# Patient Record
Sex: Male | Born: 1985 | Race: White | Hispanic: Yes | Marital: Married | Smoking: Former smoker
Health system: Southern US, Community
[De-identification: ages and names within clinical notes are randomized; demographics above are authoritative.]

## PROBLEM LIST (undated history)

## (undated) DIAGNOSIS — Z872 Personal history of diseases of the skin and subcutaneous tissue: Secondary | ICD-10-CM

## (undated) DIAGNOSIS — H6990 Unspecified Eustachian tube disorder, unspecified ear: Secondary | ICD-10-CM

## (undated) DIAGNOSIS — I10 Essential (primary) hypertension: Secondary | ICD-10-CM

## (undated) DIAGNOSIS — H6980 Other specified disorders of Eustachian tube, unspecified ear: Secondary | ICD-10-CM

## (undated) DIAGNOSIS — T7840XA Allergy, unspecified, initial encounter: Secondary | ICD-10-CM

## (undated) HISTORY — DX: Allergy, unspecified, initial encounter: T78.40XA

## (undated) HISTORY — DX: Unspecified eustachian tube disorder, unspecified ear: H69.90

## (undated) HISTORY — DX: Personal history of diseases of the skin and subcutaneous tissue: Z87.2

## (undated) HISTORY — DX: Essential (primary) hypertension: I10

## (undated) HISTORY — DX: Other specified disorders of Eustachian tube, unspecified ear: H69.80

## (undated) HISTORY — PX: WISDOM TOOTH EXTRACTION: SHX21

---

## 2007-07-08 ENCOUNTER — Emergency Department: Payer: Self-pay | Admitting: Emergency Medicine

## 2008-04-17 IMAGING — CR DG CHEST 2V
1 series · 2 of 2 positions shown · non-contrast
Comparison: none

REASON FOR EXAM: pt in [place]
COMMENTS:

[Series 1: view not recorded · 0.17mm/px · 2 of 2 slices shown]
[im 1/2]
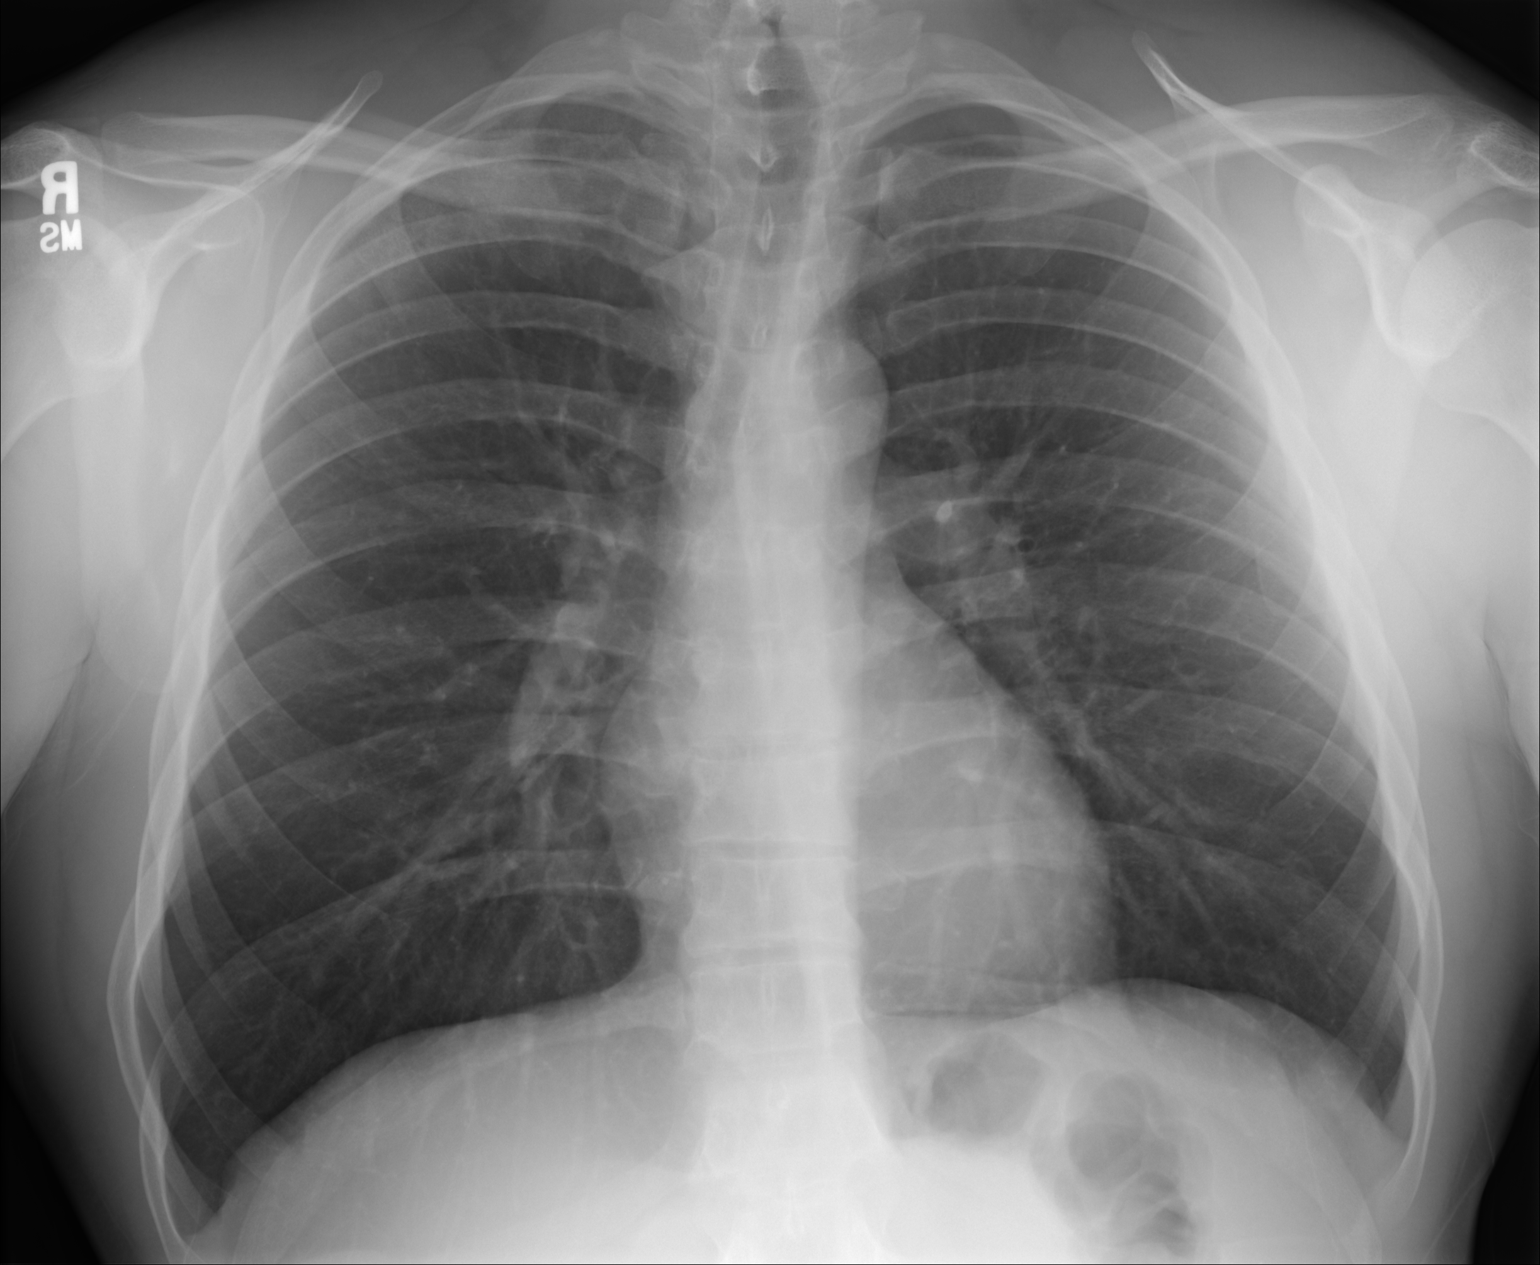
[im 2/2]
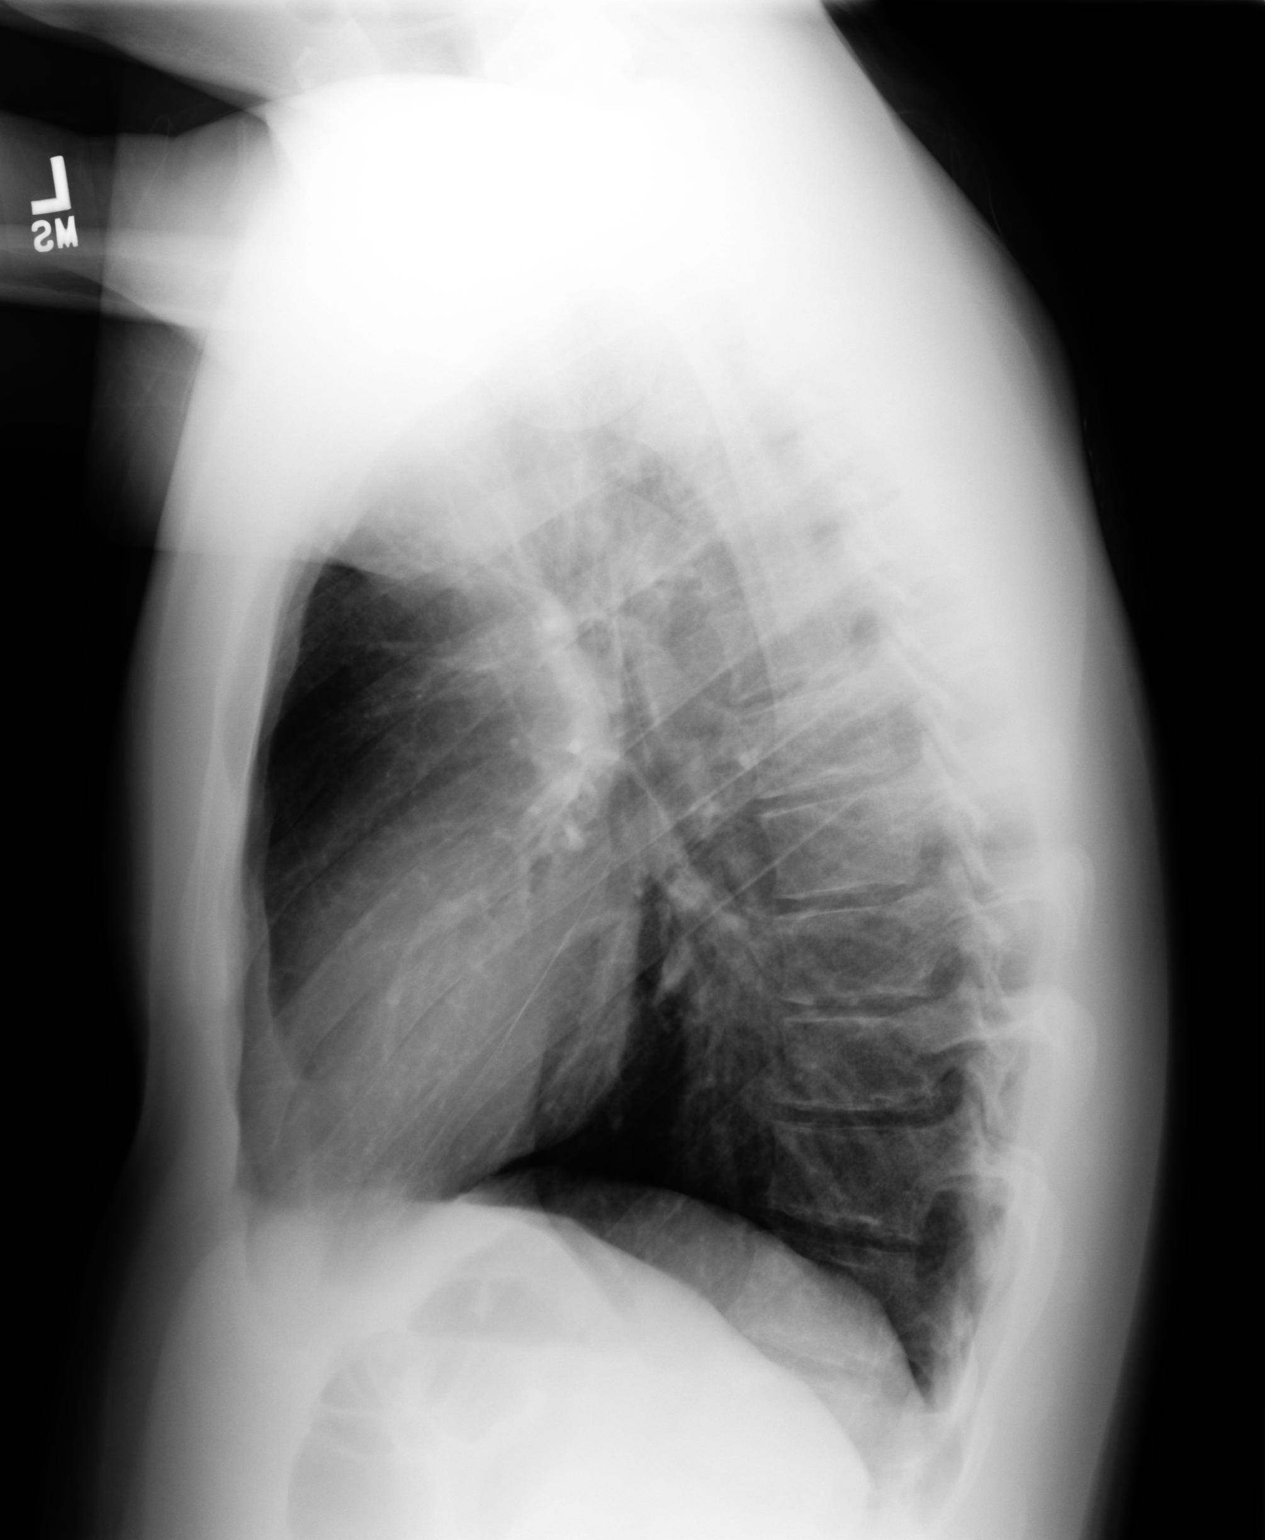

[2 of 2 positions shown; findings below may reference images not displayed]

PROCEDURE:     DXR - DXR CHEST PA (OR AP) AND LATERAL  - July 08, 2007 [DATE]

RESULT:     There is no prior exam available for comparison.

The lungs are clear. The heart and pulmonary vessels are normal. The bony
and mediastinal structures are unremarkable. There is no effusion. There is
no pneumothorax or evidence of congestive failure.
IMPRESSION: No acute cardiopulmonary disease. Incidental note is made
of a very minimal thoracic scoliosis, concave to the LEFT. This could be
positional. Correlation with clinical findings is recommended.

## 2013-07-22 ENCOUNTER — Ambulatory Visit
Admission: RE | Admit: 2013-07-22 | Discharge: 2013-07-22 | Disposition: A | Discharge status: Home / Self Care | Payer: No Typology Code available for payment source | Source: Ambulatory Visit | Attending: Occupational Medicine | Admitting: Occupational Medicine

## 2013-07-22 ENCOUNTER — Other Ambulatory Visit: Payer: Self-pay | Admitting: Occupational Medicine

## 2013-07-22 DIAGNOSIS — Z021 Encounter for pre-employment examination: Secondary | ICD-10-CM

## 2016-10-08 ENCOUNTER — Encounter: Payer: Self-pay | Admitting: Family Medicine

## 2016-10-08 ENCOUNTER — Ambulatory Visit (INDEPENDENT_AMBULATORY_CARE_PROVIDER_SITE_OTHER): Payer: Commercial Managed Care - HMO | Admitting: Family Medicine

## 2016-10-08 VITALS — BP 130/78 | HR 68 | Temp 98.9°F | Resp 16 | Wt 215.0 lb

## 2016-10-08 DIAGNOSIS — K1120 Sialoadenitis, unspecified: Secondary | ICD-10-CM

## 2016-10-08 DIAGNOSIS — H6980 Other specified disorders of Eustachian tube, unspecified ear: Secondary | ICD-10-CM | POA: Insufficient documentation

## 2016-10-08 DIAGNOSIS — J0120 Acute ethmoidal sinusitis, unspecified: Secondary | ICD-10-CM

## 2016-10-08 DIAGNOSIS — D179 Benign lipomatous neoplasm, unspecified: Secondary | ICD-10-CM | POA: Insufficient documentation

## 2016-10-08 DIAGNOSIS — L0390 Cellulitis, unspecified: Secondary | ICD-10-CM | POA: Insufficient documentation

## 2016-10-08 MED ORDER — AMOXICILLIN-POT CLAVULANATE 875-125 MG PO TABS: 1.0000 | ORAL_TABLET | Freq: Two times a day (BID) | ORAL | 0 refills | Status: DC

## 2016-10-08 NOTE — Progress Notes (Signed)
Subjective:     Patient ID: [name], male   DOB: [birth year], 31 y.o.   MRN: [id number]  HPI  Chief Complaint  Patient presents with  . URI    Patient reports that he has a cough and congestion X 3 weeks. Patient reports that he has been talking Sudafed with no relief.   Patient reports increased sinus pressure, purulent sinus drainage, post nasal drainage and accompanying cough. In association with this he has had intermittent right posterior Jaw swelling c/w parotitis. He has seen his dentist and they set him up with ENT on 1/17.   Review of Systems     Objective:   Physical Exam  Constitutional: He appears well-developed and well-nourished. No distress.  Ears: T.M's intact without inflammation Parotid: not swelling or tender on presentation Sinuses: non-tender (localizes to paranasal area) Throat: tonsils absent Neck: no cervical adenopathy Lungs: clear     Assessment:    1. Acute non-recurrent ethmoidal sinusitis - amoxicillin-clavulanate (AUGMENTIN) 875-125 MG tablet; Take 1 tablet by mouth 2 (two) times daily.  Dispense: 20 tablet; Refill: 0  2. Parotitis    Plan:    Discussed use of sialogogues, hydration and to f/u with ENT.

## 2016-10-08 NOTE — Patient Instructions (Addendum)
Discussed use of sour ball type candies to increase salivary production. Do f/u with ENT as scheduled. Parotitis Introduction Parotitis means that you have irritation and swelling (inflammation) in one or both of your parotid glands. These glands make spit (saliva). They are found on each side of your face, below and in front of your earlobes. You may or may not have pain with this condition. Follow these instructions at home: Medicines  Take over-the-counter and prescription medicines only as told by your doctor.  If you were prescribed an antibiotic medicine, take it as told by your doctor. Do not stop taking the antibiotic even if you start to feel better. Managing pain and swelling  Apply warm cloths (compresses) to the swollen area as told by your doctor.  Gently rub your parotid glands as told by your doctor. General instructions  Drink enough fluid to keep your pee (urine) clear or pale yellow.  Suck on sour candy. This may help:  To make your mouth less dry.  To make more spit.  Keep your mouth clean and moist.  Gargle with a salt-water mixture 3-4 times per day, or as needed.  To make a salt-water mixture, stir -1 tsp of salt into 1 cup of warm water.  Take good care of your mouth:  Brush your teeth at least two times per day.  Floss your teeth every day.  See your dentist regularly.  Do not use tobacco products. These include cigarettes, chewing tobacco, or e-cigarettes. If you need help quitting,  ask your doctor.  Keep all follow-up visits as told by your doctor. This is important. Contact a doctor if:  You have a fever or chills.  You have new symptoms.  Your symptoms get worse.  Your symptoms do not get better with treatment. This information is not intended to replace advice given to you by your health care provider. Make sure you discuss any questions you have with your health care provider. Document Released: 10/26/2010 Document Revised:  02/29/2016 Document Reviewed: 02/16/2015  2017 Elsevier

## 2016-10-18 DIAGNOSIS — K1121 Acute sialoadenitis: Secondary | ICD-10-CM | POA: Diagnosis not present

## 2018-02-05 ENCOUNTER — Ambulatory Visit: Payer: 59 | Admitting: Family Medicine

## 2018-02-05 ENCOUNTER — Encounter: Payer: Self-pay | Admitting: Family Medicine

## 2018-02-05 VITALS — BP 144/94 | HR 72 | Temp 98.7°F | Resp 16 | Ht 69.5 in | Wt 198.0 lb

## 2018-02-05 DIAGNOSIS — F439 Reaction to severe stress, unspecified: Secondary | ICD-10-CM

## 2018-02-05 MED ORDER — SERTRALINE HCL 50 MG PO TABS: 50.0000 mg | ORAL_TABLET | Freq: Every day | ORAL | 1 refills | Status: DC
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
## 2018-02-05 NOTE — Patient Instructions (Signed)
Let me know if not tolerating the medication.

## 2018-02-20 ENCOUNTER — Encounter: Payer: Self-pay | Admitting: Family Medicine

## 2018-02-20 ENCOUNTER — Ambulatory Visit (INDEPENDENT_AMBULATORY_CARE_PROVIDER_SITE_OTHER): Payer: 59 | Admitting: Family Medicine

## 2018-02-20 ENCOUNTER — Ambulatory Visit: Payer: Self-pay | Admitting: Family Medicine

## 2018-02-20 VITALS — BP 150/100 | HR 68 | Temp 99.3°F | Resp 16 | Wt 196.6 lb

## 2018-02-20 DIAGNOSIS — F439 Reaction to severe stress, unspecified: Secondary | ICD-10-CM | POA: Diagnosis not present

## 2018-02-20 NOTE — Patient Instructions (Signed)
Call in two weeks on how you are doing on the 50 mg whole pill. At that time we will decide whether to increase medication at that time.

## 2018-02-20 NOTE — Progress Notes (Signed)
  Subjective:     Patient ID: John Murray, male   DOB: 01/12/1986, 32 y.o.   MRN: [id number] Chief Complaint  Patient presents with  . Situational Stress    Patient returns to office today for a 2 week follow up, patient was last seen 02/05/18 and started on Sertraline 50mg  qd. Patient states that symptoms are unchanged, he reports good compliance and tolerance on medication. Patient reports that he did see a counselor 3 days ago and session went well.    HPI States he has been on 17 mg.of sertraline for one week. Does not feel different but counseling has helped and he feels less stressed at work. Planning time together with his wife for their anniversary away from the children.  Review of Systems     Objective:   Physical Exam  Constitutional: He appears well-developed and well-nourished. No distress.  Psychiatric: He has a normal mood and affect. His behavior is normal.       Assessment:    1. Situational stress: continue current dose of medication.    Plan:    Phone f/u in two weeks. At that time will make decision about increasing medication.

## 2018-03-09 ENCOUNTER — Other Ambulatory Visit: Payer: Self-pay | Admitting: Family Medicine

## 2018-03-09 ENCOUNTER — Telehealth: Payer: Self-pay | Admitting: Family Medicine

## 2018-03-09 NOTE — Telephone Encounter (Signed)
Patient advised.KW 

## 2018-03-09 NOTE — Telephone Encounter (Signed)
FYI. KW 

## 2018-03-09 NOTE — Telephone Encounter (Signed)
Pt stated he was advised to contact office to let Mikki Santee know how he is doing with taking Zoloft. Pt stated that it seems to be helping and he is doing well. Please advise. Thanks TNP

## 2018-03-09 NOTE — Telephone Encounter (Signed)
Please call for refills when needed. If the current dose "quits working" let me know and we can adjust dose.

## 2018-04-03 ENCOUNTER — Telehealth: Payer: Self-pay | Admitting: Family Medicine

## 2018-04-03 ENCOUNTER — Other Ambulatory Visit: Payer: Self-pay | Admitting: Family Medicine

## 2018-04-03 MED ORDER — SERTRALINE HCL 50 MG PO TABS: 50.0000 mg | ORAL_TABLET | Freq: Every day | ORAL | 1 refills | Status: DC

## 2018-04-03 NOTE — Telephone Encounter (Signed)
lov 02/20/18, last filled 02/05/18. KW

## 2018-04-03 NOTE — Telephone Encounter (Signed)
Pt needs a refill on his sertraline 50 mg  He uses [facility] [address]  [employer]

## 2018-08-05 ENCOUNTER — Encounter: Payer: Self-pay | Admitting: Family Medicine

## 2018-08-05 ENCOUNTER — Ambulatory Visit: Payer: 59 | Admitting: Family Medicine

## 2018-08-05 VITALS — BP 112/86 | HR 60 | Temp 98.1°F | Resp 15 | Wt 219.6 lb

## 2018-08-05 DIAGNOSIS — L259 Unspecified contact dermatitis, unspecified cause: Secondary | ICD-10-CM | POA: Diagnosis not present

## 2018-08-05 NOTE — Patient Instructions (Signed)
Apply antibiotic ointment daily and band-aid until resolution.

## 2018-08-05 NOTE — Progress Notes (Signed)
  Subjective:     Patient ID: John Murray, male   DOB: August 15, 1986, 32 y.o.   MRN: [id number] Chief Complaint  Patient presents with  . Skin Problem    Patient comes in office today with concerns of a bump that he found on his lower right back 7 days ago. Patient describes skin as tender to the touch and red.    HPI Reports he shaves in this area every 2-3 weeks. Wife has probed for ingrown hairs but no drainage noted. States he feels like it is improving today. No tingling or burning appreciated.  Review of Systems     Objective:   Physical Exam  Constitutional: He appears well-developed and well-nourished. No distress.  Skin:  Right lower back with quarter size area of erythema without vesicles. Residual punctate areas of eschar noted. No underlying induration.       Assessment:    1. Skin irritation from shaving: applied Polysporin abx and band-aid.    Plan:    Further f/u prn not resolving.

## 2018-10-13 ENCOUNTER — Other Ambulatory Visit: Payer: Self-pay | Admitting: Family Medicine

## 2019-05-11 ENCOUNTER — Other Ambulatory Visit: Payer: Self-pay | Admitting: Family Medicine

## 2019-05-11 DIAGNOSIS — F419 Anxiety disorder, unspecified: Secondary | ICD-10-CM

## 2019-05-11 MED ORDER — SERTRALINE HCL 50 MG PO TABS: 50.0000 mg | ORAL_TABLET | Freq: Every day | ORAL | 0 refills | Status: DC

## 2019-05-11 NOTE — Telephone Encounter (Signed)
Refilled zoloft for 90 days. He will need a visit before more refills, hasn't been seen for this >1 year. Can be video or telephone.

## 2019-05-11 NOTE — Telephone Encounter (Signed)
Spoke to patient regarding prescription. He states that he doesn't feel as if the medication is working well. Appointment scheduled for this week through doxy.

## 2019-05-11 NOTE — Telephone Encounter (Signed)
[facility] fax[facility] refill request for the following medications:  sertraline (ZOLOFT) 50 MG tablet   Please advise.  

## 2019-05-13 ENCOUNTER — Ambulatory Visit (INDEPENDENT_AMBULATORY_CARE_PROVIDER_SITE_OTHER): Payer: 59 | Admitting: Physician Assistant

## 2019-05-13 DIAGNOSIS — F419 Anxiety disorder, unspecified: Secondary | ICD-10-CM

## 2019-05-13 MED ORDER — FLUOXETINE HCL 20 MG PO TABS: 20.0000 mg | ORAL_TABLET | Freq: Every day | ORAL | 1 refills | Status: DC

## 2019-05-13 NOTE — Patient Instructions (Signed)

## 2019-05-13 NOTE — Progress Notes (Signed)
Patient: John Murray Male    DOB: 09-05-1986   33 y.o.   MRN: [id number] Visit Date: 05/13/2019  Today's Provider: Trinna Post, PA-C   Chief Complaint  Patient presents with  . Follow-up    Anxiety   Subjective:    Virtual Visit via Video Note  I connected with John Murray on 05/13/19 at 10:00 AM EDT by a video enabled telemedicine application and verified that I am speaking with the correct person using two identifiers.  Location: Patient: Home Provider: Office   I discussed the limitations of evaluation and management by telemedicine and the availability of in person appointments. The patient expressed understanding and agreed to proceed.   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 Has previously deployed to [place].   He  was last seen for this 1 years ago. Changes made at last visit include start sertraline 50 mg daily. He was started on it 02/2018. Works rotating shifts as well as 24 hour shifts and feels some fatigue during the day but felt more tired taking this medication. He has not taken it in the past few days and he felt this improved. Felt this didn't work well enough to control mood. His mother may be on Prozac. He is using employee assistance for counseling and peer support groups within his work. Open to trying a different medication.    He reports excellent compliance with treatment.   He reports excellent tolerance of treatment. Current symptoms include: depressed mood, fatigue and insomnia He feels he is Unchanged since last visit.  Depression screen [facility] 2/9 05/13/2019  Decreased Interest 0  Down, Depressed, Hopeless 0  PHQ - 2 Score 0  Altered sleeping 1  Tired, decreased energy 1  Change in appetite 0  Feeling bad or failure about yourself  0  Trouble concentrating 0  Moving slowly or fidgety/restless 0  Suicidal thoughts 0  PHQ-9 Score 2  Difficult doing work/chores Not  difficult at all   GAD 7 : Generalized Anxiety Score 05/13/2019  Nervous, Anxious, on Edge 1  Control/stop worrying 0  Worry too much - different things 0  Trouble relaxing 2  Restless 0  Easily annoyed or irritable 1  Afraid - awful might happen 0  Total GAD 7 Score 4  Anxiety Difficulty Not difficult at all      ------------------------------------------------------------------------    No Known Allergies   Current Outpatient Medications:  .  sertraline (ZOLOFT) 50 MG tablet, Take 1 tablet (50 mg total) by mouth daily., Disp: 90 tablet, Rfl: 0  Review of Systems  Constitutional: Positive for activity change and fatigue. Negative for appetite change.  Respiratory: Positive for shortness of breath.   Psychiatric/Behavioral: Positive for sleep disturbance. Negative for agitation and decreased concentration. The patient is nervous/anxious.     Social History   Tobacco Use  . Smoking status: Never Smoker  . Smokeless tobacco: Current User    Types: Chew  Substance Use Topics  . Alcohol use: Yes    Comment: 1-2 occasionally       Objective:   There were no vitals taken for this visit. There were no vitals filed for this visit.   Physical Exam Constitutional:      Appearance: Normal appearance.  Neurological:     Mental Status: He is alert and oriented to person, place, and time. Mental status is at baseline.  Psychiatric:  Mood and Affect: Mood normal.        Behavior: Behavior normal.      No results found for any visits on 05/13/19.     Assessment & Plan    1. Anxiety  Will switch from zoloft to prozac as below. Counseled it will take 2-4 weeks to have effect and side effects may include GI upset, headache, dizziness and sexual dysfunction. Plan to follow up by telephone in 6-8 weeks.   - FLUoxetine (PROZAC) 20 MG tablet; Take 1 tablet (20 mg total) by mouth daily.  Dispense: 90 tablet; Refill: 1  I discussed the assessment and treatment plan  with the patient. The patient was provided an opportunity to ask questions and all were answered. The patient agreed with the plan and demonstrated an understanding of the instructions.   The patient was advised to call back or seek an in-person evaluation if the symptoms worsen or if the condition fails to improve as anticipated.     Trinna Post, PA-C  [facility]

## 2019-07-08 ENCOUNTER — Ambulatory Visit: Payer: 59 | Admitting: Physician Assistant

## 2019-07-08 NOTE — Progress Notes (Unsigned)
       Patient: John Murray Male    DOB: 1986-10-06   33 y.o.   MRN: [id number] Visit Date: 07/08/2019  Today's Provider: Trinna Post, PA-C   No chief complaint on file.  Subjective:     HPI  No Known Allergies   Current Outpatient Medications:  .  FLUoxetine (PROZAC) 20 MG tablet, Take 1 tablet (20 mg total) by mouth daily., Disp: 90 tablet, Rfl: 1  Review of Systems  Social History   Tobacco Use  . Smoking status: Never Smoker  . Smokeless tobacco: Current User    Types: Chew  Substance Use Topics  . Alcohol use: Yes    Comment: 1-2 occasionally       Objective:   There were no vitals taken for this visit. There were no vitals filed for this visit.There is no height or weight on file to calculate BMI.   Physical Exam   No results found for any visits on 07/08/19.     Assessment & [place], PA-C  [facility]

## 2019-07-13 ENCOUNTER — Telehealth (INDEPENDENT_AMBULATORY_CARE_PROVIDER_SITE_OTHER): Payer: 59 | Admitting: Physician Assistant

## 2019-07-13 DIAGNOSIS — F419 Anxiety disorder, unspecified: Secondary | ICD-10-CM

## 2019-07-13 NOTE — Progress Notes (Signed)
       Patient: John Murray Male    DOB: 1986-08-16   33 y.o.   MRN: [id number] Visit Date: 07/13/2019  Today's Provider: Trinna Post, PA-C   Chief Complaint  Patient presents with  . Follow-up   Subjective:    Virtual Visit via Telephone Note  I connected with [name] on 07/13/19 at  8:00 AM EDT by telephone and verified that I am speaking with the correct person using two identifiers.  Location: Patient: Home Provider: Office   I discussed the limitations, risks, security and privacy concerns of performing an evaluation and management service by telephone and the availability of in person appointments. I also discussed with the patient that there may be a patient responsible charge related to this service. The patient expressed understanding and agreed to proceed.  HPI  Follow up for anxiety  The patient was last seen for this 1 months ago.  Changes made at last visit include Will switch from zoloft to prozac as below.  He reports poor compliance with treatment. He feels that condition is Unchanged. He is not having side effects. Nausea, insomnia and tiredness  Patient reports he took the medication for two weeks and it made him feel drained. He is not currently taking Prozac and has been talking more through his feelings with his wife and with employer provided counselor. He reports this is working well for him and he does not desire to take medication at this point.  ------------------------------------------------------------------------------------   No Known Allergies   Current Outpatient Medications:  .  FLUoxetine (PROZAC) 20 MG tablet, Take 1 tablet (20 mg total) by mouth daily., Disp: 90 tablet, Rfl: 1  Review of Systems  Constitutional: Negative.   Cardiovascular: Negative.   Psychiatric/Behavioral: Negative.     Social History   Tobacco Use  . Smoking status: Never Smoker  . Smokeless tobacco: Current User    Types: Chew  Substance Use  Topics  . Alcohol use: Yes    Comment: 1-2 occasionally       Objective:   There were no vitals taken for this visit. There were no vitals filed for this visit.There is no height or weight on file to calculate BMI.   Physical Exam   No results found for any visits on 07/13/19.     Assessment & Plan    1. Anxiety  Continue therapy. Discontinue prozac. Return to clinic as needed.   The entirety of the information documented in the History of Present Illness, Review of Systems and Physical Exam were personally obtained by me. Portions of this information were initially documented by Lynford Humphrey, CMA and reviewed by me for thoroughness and accuracy.       Trinna Post, PA-C  [facility]
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
## 2020-01-20 ENCOUNTER — Ambulatory Visit
Admission: RE | Admit: 2020-01-20 | Discharge: 2020-01-20 | Disposition: A | Discharge status: Home / Self Care | Payer: 59 | Attending: Physician Assistant | Admitting: Physician Assistant

## 2020-01-20 ENCOUNTER — Encounter: Payer: Self-pay | Admitting: Physician Assistant

## 2020-01-20 ENCOUNTER — Ambulatory Visit
Admission: RE | Admit: 2020-01-20 | Discharge: 2020-01-20 | Disposition: A | Discharge status: Home / Self Care | Payer: 59 | Source: Ambulatory Visit | Attending: Physician Assistant | Admitting: Physician Assistant

## 2020-01-20 ENCOUNTER — Other Ambulatory Visit: Payer: Self-pay

## 2020-01-20 ENCOUNTER — Ambulatory Visit: Payer: 59 | Admitting: Physician Assistant

## 2020-01-20 VITALS — BP 140/88 | HR 70 | Temp 97.1°F | Resp 16 | Wt 231.0 lb

## 2020-01-20 DIAGNOSIS — M25511 Pain in right shoulder: Secondary | ICD-10-CM | POA: Insufficient documentation

## 2020-01-20 DIAGNOSIS — G4700 Insomnia, unspecified: Secondary | ICD-10-CM

## 2020-01-20 DIAGNOSIS — R5383 Other fatigue: Secondary | ICD-10-CM | POA: Diagnosis not present

## 2020-01-20 DIAGNOSIS — Z136 Encounter for screening for cardiovascular disorders: Secondary | ICD-10-CM

## 2020-01-20 DIAGNOSIS — R7989 Other specified abnormal findings of blood chemistry: Secondary | ICD-10-CM

## 2020-01-20 NOTE — Patient Instructions (Signed)
Shoulder Pain Many things can cause shoulder pain, including:  An injury.  Moving the shoulder in the same way again and again (overuse).  Joint pain (arthritis). Pain can come from:  Swelling and irritation (inflammation) of any part of the shoulder.  An injury to the shoulder joint.  An injury to: ? Tissues that connect muscle to bone (tendons). ? Tissues that connect bones to each other (ligaments). ? Bones. Follow these instructions at home: Watch for changes in your symptoms. Let your doctor know about them. Follow these instructions to help with your pain. If you have a sling:  Wear the sling as told by your doctor. Remove it only as told by your doctor.  Loosen the sling if your fingers: ? Tingle. ? Become numb. ? Turn cold and blue.  Keep the sling clean.  If the sling is not waterproof: ? Do not let it get wet. ? Take the sling off when you shower or bathe. Managing pain, stiffness, and swelling   If told, put ice on the painful area: ? Put ice in a plastic bag. ? Place a towel between your skin and the bag. ? Leave the ice on for 20 minutes, 2-3 times a day. Stop putting ice on if it does not help with the pain.  Squeeze a soft ball or a foam pad as much as possible. This prevents swelling in the shoulder. It also helps to strengthen the arm. General instructions  Take over-the-counter and prescription medicines only as told by your doctor.  Keep all follow-up visits as told by your doctor. This is important. Contact a doctor if:  Your pain gets worse.  Medicine does not help your pain.  You have new pain in your arm, hand, or fingers. Get help right away if:  Your arm, hand, or fingers: ? Tingle. ? Are numb. ? Are swollen. ? Are painful. ? Turn white or blue. Summary  Shoulder pain can be caused by many things. These include injury, moving the shoulder in the same away again and again, and joint pain.  Watch for changes in your symptoms.  Let your doctor know about them.  This condition may be treated with a sling, ice, and pain medicine.  Contact your doctor if the pain gets worse or you have new pain. Get help right away if your arm, hand, or fingers tingle or get numb, swollen, or painful.  Keep all follow-up visits as told by your doctor. This is important. This information is not intended to replace advice given to you by your health care provider. Make sure you discuss any questions you have with your health care provider. Document Revised: 04/07/2018 Document Reviewed: 04/07/2018 Elsevier Patient Education  2020 [employer]  

## 2020-01-21 MED ORDER — MELOXICAM 15 MG PO TABS: 15.0000 mg | ORAL_TABLET | Freq: Every day | ORAL | 0 refills | Status: DC

## 2020-01-22 LAB — LIPID PANEL
Chol/HDL Ratio: 6.2 ratio — ABNORMAL HIGH (ref 0.0–5.0)
Cholesterol, Total: 206 mg/dL — ABNORMAL HIGH (ref 100–199)
HDL: 33 mg/dL — ABNORMAL LOW (ref >39)
LDL Chol Calc (NIH): 123 mg/dL — ABNORMAL HIGH (ref 0–99)
Triglycerides: 281 mg/dL — ABNORMAL HIGH (ref 0–149)
VLDL Cholesterol Cal: 50 mg/dL — ABNORMAL HIGH (ref 5–40)

## 2020-01-22 LAB — TESTOSTERONE,FREE AND TOTAL
Testosterone, Free: 3.6 pg/mL — ABNORMAL LOW (ref 8.7–25.1)
Testosterone: 172 ng/dL — ABNORMAL LOW (ref 264–916)

## 2020-01-24 NOTE — Addendum Note (Signed)
Addended by: Trinna Post on: 01/24/2020 04:13 PM   Modules accepted: Orders

## 2020-01-28 ENCOUNTER — Other Ambulatory Visit: Payer: Self-pay

## 2020-01-28 DIAGNOSIS — R7989 Other specified abnormal findings of blood chemistry: Secondary | ICD-10-CM

## 2020-01-31 LAB — TESTOSTERONE,FREE AND TOTAL
Testosterone, Free: 7.3 pg/mL — ABNORMAL LOW (ref 8.7–25.1)
Testosterone: 303 ng/dL (ref 264–916)

## 2020-02-03 ENCOUNTER — Telehealth: Payer: Self-pay

## 2020-02-03 NOTE — Telephone Encounter (Signed)
Copied from [place] [id number]. Topic: Appointment Scheduling - Scheduling Inquiry for Clinic >> Feb 03, 2020  9:24 AM John Murray wrote: Reason for CRM: patient states my chart reflects its time for his Tetanus shot. Requesting orders

## 2020-02-03 NOTE — Telephone Encounter (Signed)
Patient scheduled for a nurse visit on 02/11/2020

## 2020-02-11 ENCOUNTER — Ambulatory Visit: Payer: 59 | Admitting: Urology

## 2020-02-11 ENCOUNTER — Encounter: Payer: Self-pay | Admitting: Urology

## 2020-02-11 ENCOUNTER — Other Ambulatory Visit: Payer: Self-pay

## 2020-02-11 ENCOUNTER — Ambulatory Visit: Payer: 59 | Admitting: Physician Assistant

## 2020-02-11 VITALS — BP 187/109 | HR 71 | Ht 69.0 in | Wt 220.0 lb

## 2020-02-11 DIAGNOSIS — E291 Testicular hypofunction: Secondary | ICD-10-CM

## 2020-02-11 NOTE — Progress Notes (Signed)
02/11/2020 1:25 PM   NEOMIAH MAYVILLE [birth year] [id number]  Referring provider: Trinna Post, PA-C [address] [place],  [place] [address]  Chief Complaint  Patient presents with  . Other    testosterone     HPI: Check Filak is a 34 y.o. male seen at the request of Carles Collet, PA-C for evaluation of hypogonadism.  -At recent PCP visit was complaining of fatigue and requested a testosterone level be checked. -Other symptoms include decreased libido, decreased strength/endurance, mild ED and deterioration and work performance -Total testosterone 172 ng/dL (free testosterone 3.6 pg/mL) - Repeat testosterone 303 ng/dL; low free testosterone 7.3 pg/mL -Married with children and does not desire additional children -No bothersome LUTS   PMH: Past Medical History:  Diagnosis Date  . Eustachian tube dysfunction   . History of cellulitis     Surgical History: Past Surgical History:  Procedure Laterality Date  . WISDOM TOOTH EXTRACTION     2008    Home Medications:  Allergies as of 02/11/2020   No Known Allergies     Medication List       Accurate as of Feb 11, 2020  1:25 PM. If you have any questions, ask your nurse or doctor.        meloxicam 15 MG tablet Commonly known as: MOBIC Take 1 tablet (15 mg total) by mouth daily.       Allergies: No Known Allergies  Family History: Family History  Problem Relation Age of Onset  . Prostate cancer Father   . Healthy Son     Social History:  reports that he has never smoked. His smokeless tobacco use includes chew. He reports current alcohol use. He reports that he does not use drugs.   Physical Exam: BP (!) 187/109   Pulse 71   Ht 5\' 9"  (1.753 m)   Wt 220 lb (99.8 kg)   BMI 32.49 kg/m   Constitutional:  Alert and oriented, No acute distress. HEENT: [place] AT, moist mucus membranes.  Trachea midline, no masses. Cardiovascular: No clubbing, cyanosis, or edema. Respiratory: Normal respiratory  effort, no increased work of breathing. GU: Phallus without lesions, testes descended bilaterally without masses or tenderness.  Normal size ~ 20cc bilaterally Skin: No rashes, bruises or suspicious lesions. Neurologic: Grossly intact, no focal deficits, moving all 4 extremities. Psychiatric: Normal mood and affect.   Assessment & Plan:    - Hypogonadism 34 y.o. male with symptomatic hypogonadism and abnormal a.m. free testosterone levels.  We discussed various methods of testosterone replacement including intramuscular injections, topical gels and Xyosted.  The off label use for Clomid was also discussed if LH is not elevated.  Potential side effects of testosterone replacement were discussed including stimulation of benign prostatic growth with lower urinary tract symptoms; erythrocytosis; edema; gynecomastia; worsening sleep apnea; venous thromboembolism; testicular atrophy and infertility. Recent studies suggesting an increased incidence of heart attack and stroke in patients taking testosterone was discussed. He was informed there is conflicting evidence regarding the impact of testosterone therapy on cardiovascular risk. The theoretical risk of growth stimulation of an undetected prostate cancer was also discussed.  He was informed that current evidence does not provide any definitive answers regarding the risks of testosterone therapy on prostate cancer and cardiovascular disease. The need for periodic monitoring of his testosterone level, PSA, hematocrit and DRE was discussed.  He will think over these options.  LH and prolactin were ordered.   Abbie Sons, [facility] [address], [place], [place] [address] [phone]

## 2020-02-12 LAB — LUTEINIZING HORMONE: LH: 10.4 m[IU]/mL — ABNORMAL HIGH (ref 1.7–8.6)

## 2020-02-12 LAB — PROLACTIN: Prolactin: 9.5 ng/mL (ref 4.0–15.2)

## 2020-02-13 ENCOUNTER — Telehealth: Payer: Self-pay | Admitting: Urology

## 2020-02-13 NOTE — Telephone Encounter (Signed)
Prolactin level was normal.  LH was slightly elevated so he would not be a candidate for oral Clomid.  We discussed replacement options at next visit including topicals, intramuscular injections and Xyosted.  He was going to think over these options.  Does he have a preference?

## 2020-02-14 MED ORDER — TESTOSTERONE CYPIONATE 200 MG/ML IM SOLN
200.0000 mg | INTRAMUSCULAR | 0 refills | Status: DC
Start: 2020-02-14 — End: 2020-02-22

## 2020-02-14 NOTE — Telephone Encounter (Signed)
He would like to do the  intramuscular injections . He states he would like to be scheduled for a teaching if this is approved.

## 2020-02-14 NOTE — Addendum Note (Signed)
Addended by: Abbie Sons on: 02/14/2020 01:42 PM   Modules accepted: Orders

## 2020-02-16 ENCOUNTER — Telehealth: Payer: Self-pay | Admitting: *Deleted

## 2020-02-16 NOTE — Telephone Encounter (Signed)
Waiting for prior auth. He will need to be scheduled for testosterone teaching

## 2020-02-22 ENCOUNTER — Ambulatory Visit: Payer: 59

## 2020-02-22 ENCOUNTER — Ambulatory Visit: Payer: 59 | Admitting: Urology

## 2020-02-22 ENCOUNTER — Other Ambulatory Visit: Payer: Self-pay

## 2020-02-22 ENCOUNTER — Other Ambulatory Visit: Payer: Self-pay | Admitting: Physician Assistant

## 2020-02-22 ENCOUNTER — Encounter: Payer: Self-pay | Admitting: Urology

## 2020-02-22 VITALS — BP 157/98 | HR 79 | Ht 69.0 in | Wt 220.0 lb

## 2020-02-22 DIAGNOSIS — E291 Testicular hypofunction: Secondary | ICD-10-CM

## 2020-02-22 DIAGNOSIS — M25511 Pain in right shoulder: Secondary | ICD-10-CM

## 2020-02-22 MED ORDER — BD DISP NEEDLES 21G X 1-1/2" MISC
1.0000 mg | 0 refills | Status: DC
Start: 2020-02-22 — End: 2020-07-20

## 2020-02-22 MED ORDER — BD DISP NEEDLES 18G X 1-1/2" MISC
1.0000 mg | 0 refills | Status: DC
Start: 2020-02-22 — End: 2020-07-20

## 2020-02-22 MED ORDER — SYRINGE 2-3 ML 3 ML MISC
1.0000 mg | 3 refills | Status: DC
Start: 2020-02-22 — End: 2021-12-03

## 2020-02-22 MED ORDER — TESTOSTERONE CYPIONATE 200 MG/ML IM SOLN
200.0000 mg | Freq: Once | INTRAMUSCULAR | Status: AC
  Administered 2020-02-22: 200 mg via INTRAMUSCULAR

## 2020-02-22 MED ORDER — TESTOSTERONE CYPIONATE 200 MG/ML IM SOLN: 200.0000 mg | INTRAMUSCULAR | 1 refills | Status: DC

## 2020-02-22 NOTE — Telephone Encounter (Signed)
Requested medication (s) are due for refill today- Rx for acute problem  Requested medication (s) are on the active medication list -yes  Future visit scheduled -no  Last refill: 01/21/20  Notes to clinic: Request for acute Rx refill- sent for PCP review   Requested Prescriptions  Pending Prescriptions Disp Refills   meloxicam (MOBIC) 15 MG tablet [Pharmacy Med Name: MELOXICAM 15MG  TABLETS] 30 tablet 0    Sig: TAKE 1 TABLET(15 MG) BY MOUTH DAILY      Analgesics:  COX2 Inhibitors Failed - 02/22/2020 10:54 AM      Failed - HGB in normal range and within 360 days    No results found for: HGB, HGBKUC, HGBPOCKUC, HGBOTHER, TOTHGB, HGBPLASMA        Failed - Cr in normal range and within 360 days    No results found for: [place], [place], [place], [place] - Patient is not pregnant      Passed - Valid encounter within last 12 months    Recent Outpatient Visits           1 month ago Acute pain of right shoulder   [place], Wendee Beavers, PA-C   7 months ago [place], [place], [place]   9 months ago [place], [place], PA-C   1 year ago Skin irritation from [place], [place], [place]   2 years ago [place], [place]       Future Appointments             Today McGowan, Hunt Oris, PA-C [facility]                Requested Prescriptions  Pending Prescriptions Disp Refills   meloxicam (MOBIC) 15 MG tablet [Pharmacy Med Name: MELOXICAM 15MG  TABLETS] 30 tablet 0    Sig: TAKE 1 TABLET(15 MG) BY MOUTH DAILY      Analgesics:  COX2 Inhibitors Failed - 02/22/2020 10:54 AM      Failed - HGB in normal range and within 360 days    No results found for: HGB, HGBKUC, HGBPOCKUC, HGBOTHER, TOTHGB, HGBPLASMA        Failed - Cr in normal range and within 360 days    No results  found for: CREATININE, [place]CREAU, [place], [place] - Patient is not pregnant      Passed - Valid encounter within last 12 months    Recent Outpatient Visits           1 month ago Acute pain of right shoulder   [place], Wendee Beavers, PA-C   7 months ago [place], [place], [place]   9 months ago [place], [place], [place]   1 year ago Skin irritation from [place], [place], [place]   2 years ago [place], [place], [place]       Future Appointments             Today McGowan, [facility]

## 2020-02-22 NOTE — Telephone Encounter (Signed)
I have refilled the meloxicam. However, I wouldn't recommend chronic use of this due to kidney injury and ulcers. If he is still having issues with his shoulder after this I think he should see a specialist.

## 2020-03-06 NOTE — Progress Notes (Signed)
John Murray is a 34 y.o. male with testosterone deficiency who presents today for instruction on delivering an IM injection of testosterone cypionate.    I instructed John Murray to Identify the concentration of his testosterone. Testosterone for injection is usually in the form of testosterone cypionate. These liquids come in multiple concentrations, so before giving an injection, it's very important to make sure that his intended dosage takes into account the concentration of the testosterone serum. Usually, testosterone comes in a concentration of either 100 mg/ml or 200 mg/ml.  We typically use the 200 mg/mL in this office.    Using a sterile, 18 G needle and 3 cc syringe, the testosterone cypionate was drawn up for a 1 cc injection.  To draw up the dose, I demonstrated how to first draw air into the syringe equal to the volume of the dosage. Then, wipe the top of the medication bottle with an alcohol wipe, insert the needle through the lid and into the medication, and push the air from your syringe into the bottle. Turn the bottle upside down and draw out the exact dosage of testosterone.  I demonstrated how to aspirate the syringe by hingold the syringe with its needle uncapped and pointing up in front of him.  Looking for air bubbles in the syringe. Flick the side of the syringe to get these bubbles to rise to the top.    When the dosage is bubble-free, I slowly depressed the plunger to force the air at the top of the syringe out stopping when a tiny drop of medication comes out of the tip of the syringe.  I advised him to be certain no air remained in the syringe as injecting air is very dangerous.  Being careful not to squirt or spray a significant portion of the dosage onto the floor.  Preparing the injection site, outer middle third of the vastus lateralis muscle of the thigh, I took a sterile alcohol pad and wipe the immediate area around where I intended him to inject.   I then demonstrated  how to change the needle from the 18 G to the 21 G needle.  I then gave him the syringe for injecting.  He correctly identified the injection location.  He held the syringe like a dart at a 90-degree angle above the sterile injection site. Quickly plunged it into the flesh. Before depressing the plunger, he drew back on it slightly and no blood was seen.  I advised him that if blood flashed in the syringe, he would need to remove the needle and then select a different location as he was in the vein.  Inject the medication at a steady, controlled pace.  He fully depressed the plunger, slowly pull the needle out. Pressing around the injection site with a sterile cotton swab as he did so - this preventing the emerging needle from pulling on the skin and causing extra pain.  We assessed the needle entry point for bleeding, and applied a sterile Band-Aid and/or cotton swab if needed. Disposed of the used needle and syringe in a proper sharps container.  I advised him to acquire a sharps container for his personal use at home.  I advised him that If, after injection, he experienced redness, swelling, or discomfort beyond that of normal soreness at the site of injection, call our office for an appointment and instructions.  He is to always store his medication at the recommended temperature, and always check the expiration date on the  bottle. If it's expired, don't use it.  Of course, keep all of meds out of reach of children.  Do not change his dose without consulting your provider.  His starting dose will be 1 cc every 2 weeks.  He will return 1 week after his fourth injection for a testosterone level  Ausencio Vaden, PA-C

## 2020-04-11 ENCOUNTER — Other Ambulatory Visit: Payer: Self-pay

## 2020-04-11 ENCOUNTER — Other Ambulatory Visit: Payer: 59

## 2020-04-11 DIAGNOSIS — E291 Testicular hypofunction: Secondary | ICD-10-CM

## 2020-04-12 ENCOUNTER — Other Ambulatory Visit: Payer: Self-pay

## 2020-04-12 LAB — TESTOSTERONE: Testosterone: 637 ng/dL (ref 264–916)

## 2020-04-17 ENCOUNTER — Telehealth: Payer: Self-pay | Admitting: *Deleted

## 2020-04-17 NOTE — Telephone Encounter (Signed)
Pt left vm he just missed a call and states his reception was bad on phone please call pt

## 2020-04-17 NOTE — Telephone Encounter (Signed)
-----   Message from Nori Riis, PA-C sent at 04/16/2020  1:48 PM EDT ----- Please let John Murray know that his testosterone level is within the therapeutic range.  He should continue with the present dose of 1 cc every 14 days.  He needs to have his testosterone level (one week after injection), HCT and hemoglobin in three months.

## 2020-04-17 NOTE — Telephone Encounter (Signed)
Notified patient as instructed, patient pleased. Discussed follow-up appointments, patient agrees  

## 2020-04-25 ENCOUNTER — Other Ambulatory Visit: Payer: Self-pay | Admitting: *Deleted

## 2020-04-25 DIAGNOSIS — E291 Testicular hypofunction: Secondary | ICD-10-CM

## 2020-04-26 ENCOUNTER — Other Ambulatory Visit: Payer: Self-pay

## 2020-04-26 ENCOUNTER — Other Ambulatory Visit: Payer: 59

## 2020-04-26 DIAGNOSIS — E291 Testicular hypofunction: Secondary | ICD-10-CM

## 2020-04-27 LAB — TESTOSTERONE: Testosterone: 795 ng/dL (ref 264–916)

## 2020-07-14 ENCOUNTER — Other Ambulatory Visit: Payer: Self-pay | Admitting: Family Medicine

## 2020-07-14 DIAGNOSIS — E291 Testicular hypofunction: Secondary | ICD-10-CM

## 2020-07-18 ENCOUNTER — Other Ambulatory Visit: Payer: Self-pay

## 2020-07-18 ENCOUNTER — Other Ambulatory Visit: Payer: Self-pay | Admitting: Urology

## 2020-07-18 ENCOUNTER — Other Ambulatory Visit: Payer: 59

## 2020-07-18 DIAGNOSIS — E291 Testicular hypofunction: Secondary | ICD-10-CM

## 2020-07-18 NOTE — Telephone Encounter (Signed)
Pt came in this morning for lab visit and wants to know if we can change his RX to [facility] [address].  Also, he was asking about syringes.  He wants to know if there is anyone local he can buy them instead of RX.  He said [facility] more expensive than [facility].

## 2020-07-19 LAB — HEMATOCRIT: Hematocrit: 51.2 % — ABNORMAL HIGH (ref 37.5–51.0)

## 2020-07-19 LAB — HEMOGLOBIN: Hemoglobin: 17.6 g/dL (ref 13.0–17.7)

## 2020-07-20 ENCOUNTER — Telehealth: Payer: Self-pay | Admitting: Family Medicine

## 2020-07-20 MED ORDER — "BD DISP NEEDLES 18G X 1-1/2"" MISC": 1.0000 mg | 0 refills | Status: DC

## 2020-07-20 MED ORDER — "BD DISP NEEDLES 21G X 1-1/2"" MISC": 1.0000 mg | 0 refills | Status: DC

## 2020-07-20 NOTE — Telephone Encounter (Signed)
Patient notified and voiced understanding. Appointments have been made.  

## 2020-07-20 NOTE — Telephone Encounter (Signed)
Patient does need a refill on the Testosterone. He will be all the way out next week. I have already sent the [facility] to [facility].

## 2020-07-20 NOTE — Telephone Encounter (Signed)
I believe already sent the refill to [facility].  We will need to call [facility] and cancel the testosterone prescription prior to me refilling it [facility].

## 2020-07-21 MED ORDER — TESTOSTERONE CYPIONATE 200 MG/ML IM SOLN: 200.0000 mg | INTRAMUSCULAR | 1 refills | Status: DC

## 2020-08-07 ENCOUNTER — Other Ambulatory Visit: Payer: Self-pay | Admitting: *Deleted

## 2020-08-07 NOTE — Telephone Encounter (Signed)
Patient called asking for refill, he's due for his shot tomorrow. It looks like (see below) the original rx didn't go thru? Also [facility] and they have the [facility] but not the testosterone.  testosterone cypionate (DEPOTESTOSTERONE CYPIONATE) 200 MG/ML injection 4 mL 1 07/21/2020    Sig - Route: Inject 1 mL (200 mg total) into the muscle every 14 (fourteen) days. - Intramuscular   Sent to pharmacy as: testosterone cypionate (DEPOTESTOSTERONE CYPIONATE) 200 MG/ML injection   E-Prescribing Status: Verification status not available for this order

## 2020-08-09 MED ORDER — TESTOSTERONE CYPIONATE 200 MG/ML IM SOLN: 200.0000 mg | INTRAMUSCULAR | 0 refills | Status: DC

## 2020-10-24 ENCOUNTER — Other Ambulatory Visit: Payer: Self-pay | Admitting: Family Medicine

## 2020-10-24 DIAGNOSIS — E291 Testicular hypofunction: Secondary | ICD-10-CM

## 2020-10-25 ENCOUNTER — Other Ambulatory Visit: Payer: 59

## 2020-10-25 ENCOUNTER — Other Ambulatory Visit: Payer: Self-pay

## 2020-10-25 DIAGNOSIS — E291 Testicular hypofunction: Secondary | ICD-10-CM

## 2020-10-26 LAB — TESTOSTERONE: Testosterone: 461 ng/dL (ref 264–916)

## 2020-10-26 LAB — HEMATOCRIT: Hematocrit: 48 % (ref 37.5–51.0)

## 2020-10-26 LAB — HEMOGLOBIN: Hemoglobin: 16.2 g/dL (ref 13.0–17.7)

## 2020-10-30 IMAGING — CR DG SHOULDER 2+V*R*
1 series · 3 of 3 positions shown · non-contrast
Comparison: None.

CLINICAL DATA: Acute right shoulder pain

EXAM:
RIGHT SHOULDER - 2+ VIEW

[Series 1: dg shoulder right · 0.14mm/px · 3 of 3 slices shown]
[im 1/3]
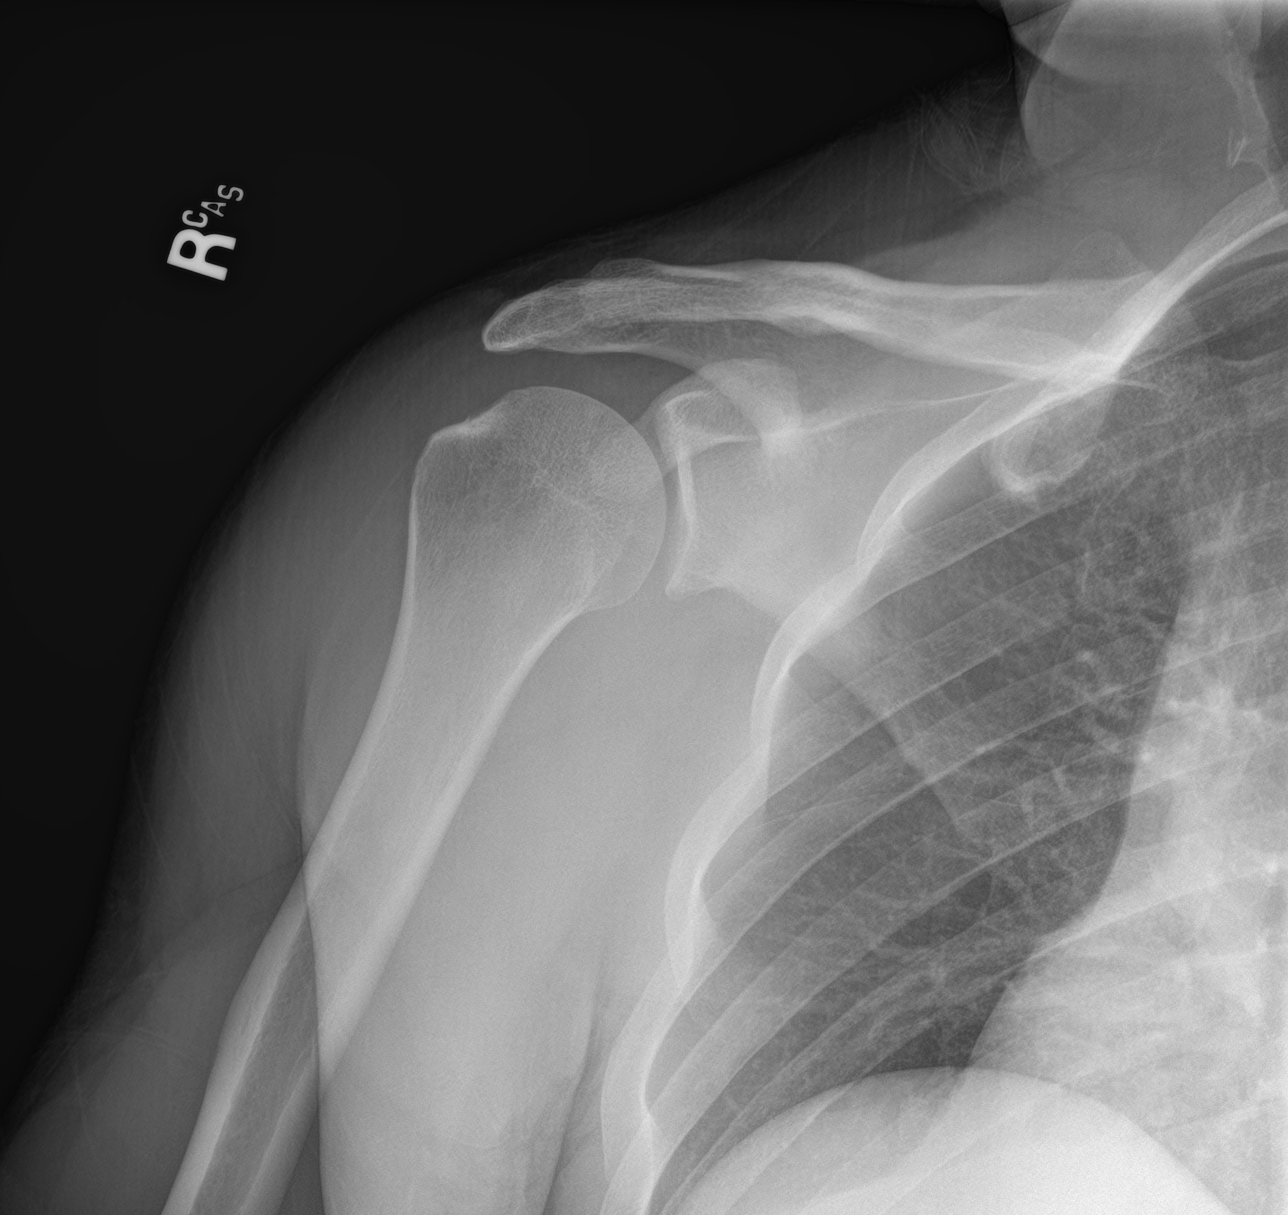
[im 2/3]
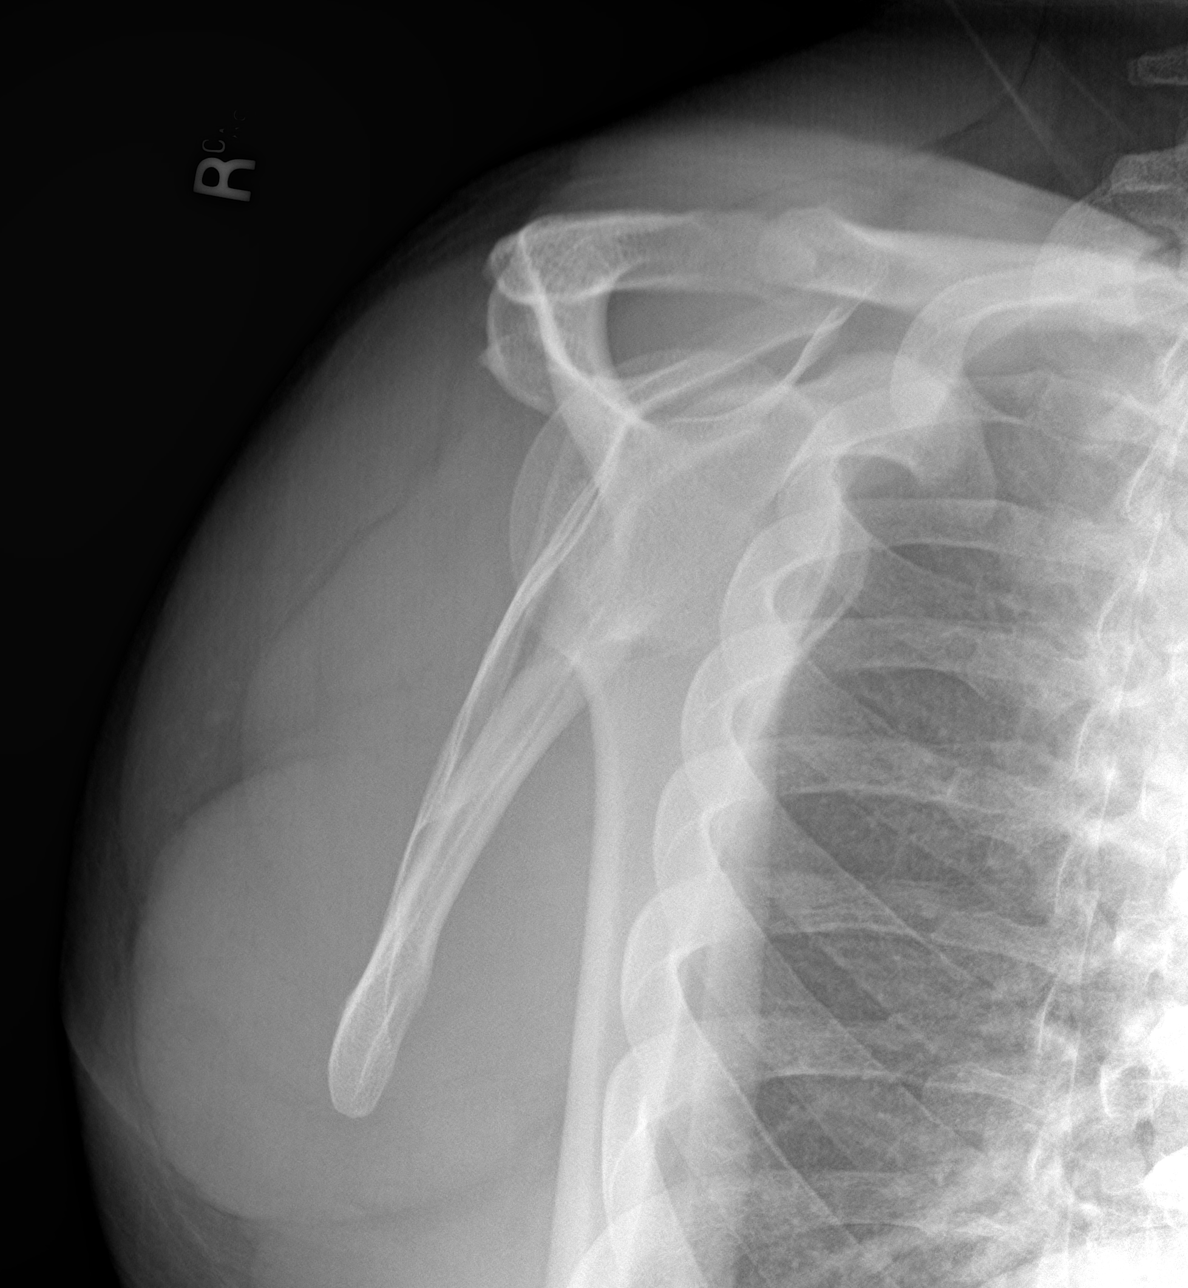
[im 3/3]
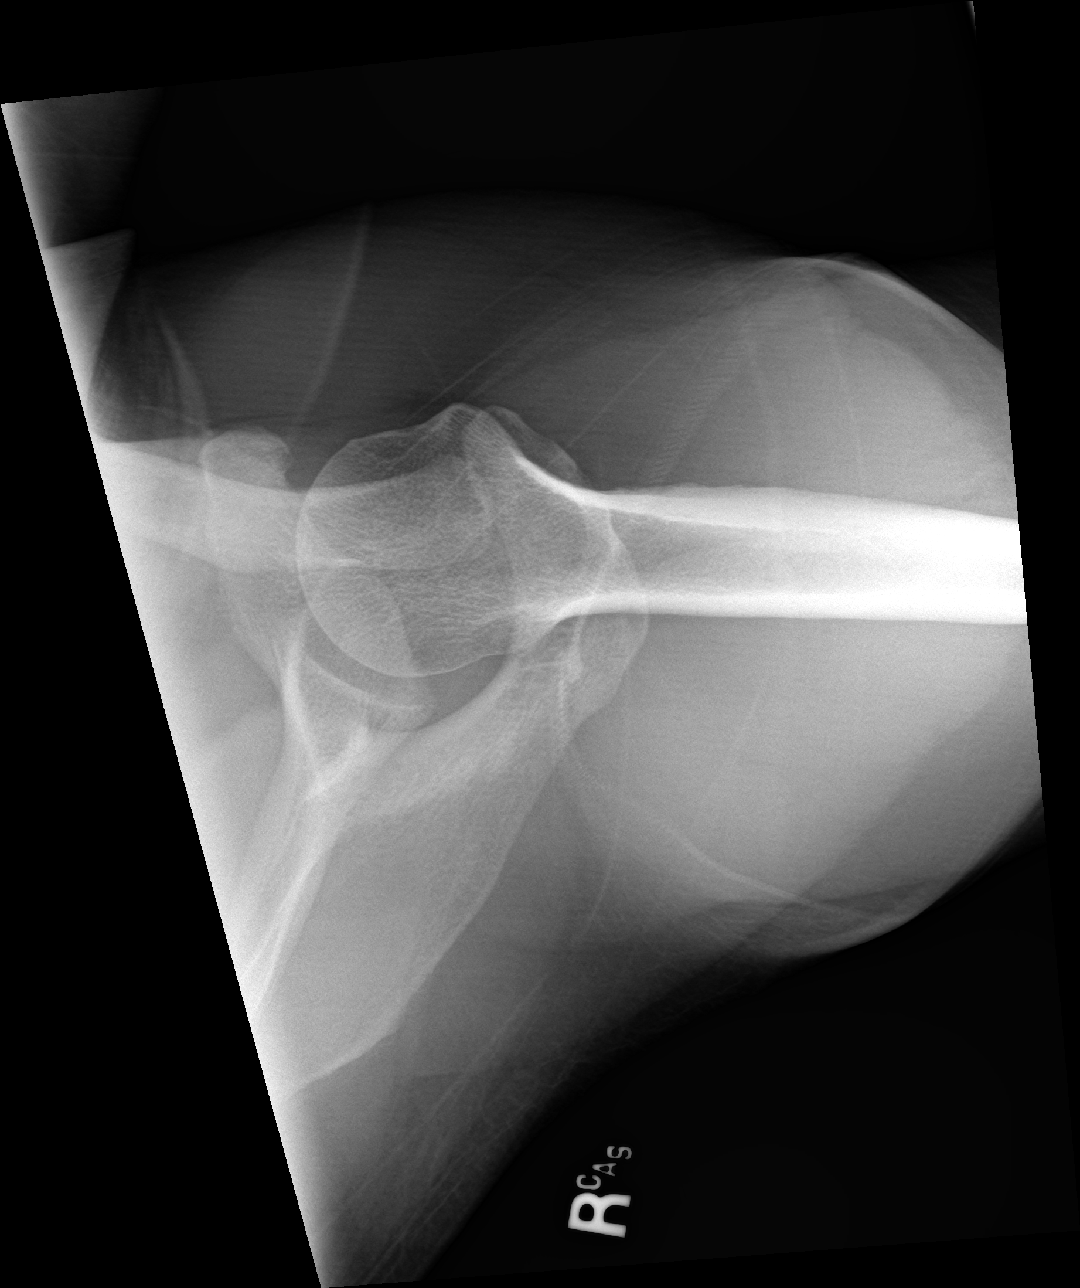

[3 of 3 positions shown; findings below may reference images not displayed]

FINDINGS: There is no evidence of fracture or dislocation. There is no
evidence of arthropathy or other focal bone abnormality. Soft
tissues are unremarkable.
IMPRESSION: Negative.

## 2020-10-30 NOTE — Progress Notes (Signed)
10/31/2020 9:19 AM   John Murray 01-Oct-1986 [id number]  Referring provider: Trinna Post, PA-C [address] [place],  [place] [address]  Chief Complaint  Patient presents with  . Hypogonadism   Urological history 1. Testosterone deficiency - managed with testosterone cypionate 200mg /mL, 1 cc every 14 days - testosterone level 461 ng/dL in 10/2020 - HCT and HGB are WNL  HPI: John Murray is a 35 y.o. male who presents today for a 6 months follow up.  He is noticing an increase in his fatigue the week after his injection.   He is experiencing mild ED.   He is still experiencing spontaneous erections in the morning, but he is noticing a week after his injection he is experiencing difficulty achieving and maintaining erections.  Patient still having spontaneous erections.  He denies any pain or curvature with erections.   He has urgency after he takes his vitamins.    Patient denies any modifying or aggravating factors.  Patient denies any gross hematuria, dysuria or suprapubic/flank pain.  Patient denies any fevers, chills, nausea or vomiting.   UA is negative.     SHIM    Row Name 10/31/20 0848         SHIM: Over the last 6 months:   How do you rate your confidence that you could get and keep an erection? Moderate     When you had erections with sexual stimulation, how often were your erections hard enough for penetration (entering your partner)? Most Times (much more than half the time)     During sexual intercourse, how often were you able to maintain your erection after you had penetrated (entered) your partner? Most Times (much more than half the time)     During sexual intercourse, how difficult was it to maintain your erection to completion of intercourse? Not Difficult     When you attempted sexual intercourse, how often was it satisfactory for you? Most Times (much more than half the time)           SHIM Total Score   SHIM 20             PMH: Past Medical History:  Diagnosis Date  . Eustachian tube dysfunction   . History of cellulitis     Surgical History: Past Surgical History:  Procedure Laterality Date  . WISDOM TOOTH EXTRACTION     2008    Home Medications:  Allergies as of 10/31/2020   No Known Allergies     Medication List       Accurate as of October 31, 2020  9:19 AM. If you have any questions, ask your nurse or doctor.        STOP taking these medications   meloxicam 15 MG tablet Commonly known as: MOBIC Stopped by: Belanna Manring, PA-C     TAKE these medications   2-3CC SYRINGE 3 ML Misc 1 mg by Does not apply route every 14 (fourteen) days.   BD Disp Needles 18G X 1-1/2" Misc Generic drug: NEEDLE (DISP) 18 G 1 mg by Does not apply route every 14 (fourteen) days.   BD Disp Needles 21G X 1-1/2" Misc Generic drug: NEEDLE (DISP) 21 G 1 mg by Does not apply route every 14 (fourteen) days.   testosterone cypionate 200 MG/ML injection Commonly known as: DEPOTESTOSTERONE CYPIONATE Inject 0.75 cc into the muscle every 7 (seven) days. What changed:   how much to take  how to take this  when to take this  additional instructions Changed by: John Gowan, PA-C       Allergies: No Known Allergies  Family History: Family History  Problem Relation Age of Onset  . Prostate cancer Father   . Healthy Son     Social History:  reports that he has never smoked. His smokeless tobacco use includes chew. He reports current alcohol use. He reports that he does not use drugs.  ROS: Pertinent ROS in HPI  Physical Exam: BP (!) 154/94   Pulse 73   Ht 5\' 9"  (1.753 m)   Wt 220 lb (99.8 kg)   BMI 32.49 kg/m   Constitutional:  Well nourished. Alert and oriented, No acute distress. HEENT: [place] AT, mask in place.  Trachea midline Cardiovascular: No clubbing, cyanosis, or edema. Respiratory: Normal respiratory effort, no increased work of breathing. Neurologic: Grossly intact, no  focal deficits, moving all 4 extremities. Psychiatric: Normal mood and affect.  Laboratory Data: Lab Results  Component Value Date   HGB 16.2 10/25/2020   HCT 48.0 10/25/2020    Lab Results  Component Value Date   TESTOSTERONE 461 10/25/2020       Component Value Date/Time   CHOL 206 (H) 01/20/2020 0914   HDL 33 (L) 01/20/2020 0914   CHOLHDL 6.2 (H) 01/20/2020 0914   LDLCALC 123 (H) 01/20/2020 0914   Urinalysis Component     Latest Ref Rng & Units 10/31/2020  Specific Gravity, UA     1.005 - 1.030 >1.030 (H)  pH, UA     5.0 - 7.5 5.0  Color, UA     Yellow Yellow  Appearance Ur     Clear Clear  Leukocytes,UA     Negative Negative  Protein,UA     Negative/Trace Negative  Glucose, UA     Negative Negative  Ketones, UA     Negative Negative  RBC, UA     Negative Negative  Bilirubin, UA     Negative Negative  Urobilinogen, Ur     0.2 - 1.0 mg/dL 0.2  Nitrite, UA     Negative Negative  Microscopic Examination      See below:   Component     Latest Ref Rng & Units 10/31/2020  WBC, UA     0 - 5 /hpf None seen  RBC     0 - 2 /hpf 0-2  Epithelial Cells (non renal)     0 - 10 /hpf None seen  Bacteria, UA     None seen/Few None seen   I have reviewed the labs.   Pertinent Imaging: No recent imaging  Assessment & Plan:    1. Testosterone deficiency  - testosterone level at therapeutic levels - change testosterone cypionate 200 mg/mL, to 0.75 cc every 7 days  - RTC after his fourth injection for HCT/Hgb and testosterone   2. Urgency - Continue conservative management, avoiding bladder irritants and timed voiding's - RTC in 6 months for symptoms recheck, as testosterone therapy can cause prostate enlargement and worsen LUTS  3. Erectile dysfunction:    -SHIM score is 20 -We will adjust testosterone cypionate dosage and scheduling of injection to see if this improves -RTC in 6 months for SHIM score and exam, as testosterone therapy can affect  erections  Return for After fourth injection for hemoglobin, hematocrit and testosterone level.  These notes generated with voice recognition software. I apologize for typographical errors.  Zara Council, [facility] [address]  [place], [place] [address] [phone]

## 2020-10-31 ENCOUNTER — Encounter: Payer: Self-pay | Admitting: Urology

## 2020-10-31 ENCOUNTER — Ambulatory Visit: Payer: 59 | Admitting: Urology

## 2020-10-31 ENCOUNTER — Other Ambulatory Visit: Payer: Self-pay

## 2020-10-31 VITALS — BP 154/94 | HR 73 | Ht 69.0 in | Wt 220.0 lb

## 2020-10-31 DIAGNOSIS — N529 Male erectile dysfunction, unspecified: Secondary | ICD-10-CM | POA: Diagnosis not present

## 2020-10-31 DIAGNOSIS — R3915 Urgency of urination: Secondary | ICD-10-CM

## 2020-10-31 DIAGNOSIS — E291 Testicular hypofunction: Secondary | ICD-10-CM

## 2020-10-31 LAB — MICROSCOPIC EXAMINATION
Bacteria, UA: NONE SEEN
Epithelial Cells (non renal): NONE SEEN /hpf (ref 0–10)
WBC, UA: NONE SEEN /hpf (ref 0–5)

## 2020-10-31 LAB — URINALYSIS, COMPLETE
Bilirubin, UA: NEGATIVE
Glucose, UA: NEGATIVE
Ketones, UA: NEGATIVE
Leukocytes,UA: NEGATIVE
Nitrite, UA: NEGATIVE
Protein,UA: NEGATIVE
RBC, UA: NEGATIVE
Specific Gravity, UA: >1.03 — ABNORMAL HIGH (ref 1.005–1.030)
Urobilinogen, Ur: 0.2 mg/dL (ref 0.2–1.0)
pH, UA: 5 (ref 5.0–7.5)

## 2020-10-31 MED ORDER — TESTOSTERONE CYPIONATE 200 MG/ML IM SOLN: INTRAMUSCULAR | 0 refills | Status: DC

## 2020-12-11 ENCOUNTER — Telehealth: Payer: Self-pay

## 2020-12-11 NOTE — Telephone Encounter (Signed)
Patient left a message on the triage line stating there was he has a question about his testosterone script. Attempted to return call, no answer could not leave vmail.

## 2020-12-11 NOTE — Telephone Encounter (Signed)
Tried returning call. No answer and no voicemail.

## 2020-12-13 NOTE — Telephone Encounter (Signed)
Spoke to patient and he was giving himself 16ml and not 0.68ml. The pharmacy gave him 3 74ml vials. He will be 1 week off then back on to the regular dose.

## 2020-12-21 ENCOUNTER — Other Ambulatory Visit: Payer: Self-pay

## 2020-12-22 ENCOUNTER — Other Ambulatory Visit: Payer: Self-pay

## 2020-12-22 ENCOUNTER — Other Ambulatory Visit: Payer: 59

## 2020-12-22 DIAGNOSIS — E291 Testicular hypofunction: Secondary | ICD-10-CM

## 2020-12-23 LAB — HEMATOCRIT: Hematocrit: 49.4 % (ref 37.5–51.0)

## 2020-12-23 LAB — HEMOGLOBIN: Hemoglobin: 16.8 g/dL (ref 13.0–17.7)

## 2020-12-23 LAB — TESTOSTERONE: Testosterone: 158 ng/dL — ABNORMAL LOW (ref 264–916)

## 2021-02-17 ENCOUNTER — Other Ambulatory Visit: Payer: Self-pay | Admitting: Urology

## 2021-02-17 DIAGNOSIS — E291 Testicular hypofunction: Secondary | ICD-10-CM

## 2021-02-23 ENCOUNTER — Other Ambulatory Visit: Payer: Self-pay | Admitting: Urology

## 2021-02-23 ENCOUNTER — Telehealth: Payer: Self-pay

## 2021-02-23 DIAGNOSIS — E291 Testicular hypofunction: Secondary | ICD-10-CM

## 2021-02-23 MED ORDER — TESTOSTERONE CYPIONATE 200 MG/ML IM SOLN: INTRAMUSCULAR | 0 refills | Status: DC

## 2021-02-23 NOTE — Telephone Encounter (Signed)
Per Larene Beach corrected script was sent to pharmacy. Patient needs to come to the office for a repeat testosterone draw. Patient was scheduled for next Monday due to his work schedule this will be a peak draw not mid level, he injects on Saturday.  Called pharmacy and discontinued incorrect script sent yesterday and explained that the script sent today with instructions of 0.42ml every week was the correct dose. Pharmacy confirmed script change

## 2021-02-23 NOTE — Progress Notes (Signed)
Testosterone prescription for 0.75 cc every 7 days sent to pharmacy.

## 2021-02-23 NOTE — Telephone Encounter (Signed)
Patient called needing clarification on medication dosing refill sent in to pharmacy yesterday was at his old dose 59ml every 2wks, he is currently injecting 0.5ml every week.  Spoke w/Patient about mychart message sent on labs draw in 3/22 he states he was and has been injecting weekly at 0.68ml. He is currently due to inject tomorrow.

## 2021-02-26 ENCOUNTER — Other Ambulatory Visit: Payer: 59

## 2021-02-26 ENCOUNTER — Other Ambulatory Visit: Payer: Self-pay

## 2021-02-26 DIAGNOSIS — E291 Testicular hypofunction: Secondary | ICD-10-CM

## 2021-02-27 ENCOUNTER — Telehealth: Payer: Self-pay | Admitting: *Deleted

## 2021-02-27 LAB — TESTOSTERONE: Testosterone: 967 ng/dL — ABNORMAL HIGH (ref 264–916)

## 2021-02-27 NOTE — Telephone Encounter (Signed)
Patient called and left message about his injection . He stated he gives his injection on Sunday and had his labs drawn on Monday .

## 2021-04-02 ENCOUNTER — Other Ambulatory Visit: Payer: Self-pay | Admitting: Urology

## 2021-04-24 ENCOUNTER — Other Ambulatory Visit: Payer: Self-pay | Admitting: *Deleted

## 2021-04-24 DIAGNOSIS — E291 Testicular hypofunction: Secondary | ICD-10-CM

## 2021-05-24 ENCOUNTER — Ambulatory Visit: Payer: Self-pay | Admitting: *Deleted

## 2021-05-24 NOTE — Telephone Encounter (Signed)
Patient is calling to report he has irritated area on penis. Patient has history of yeast infection and friction burn - but this area is itching. Patient is working today and request appointment tomorrow. Call to office- no appointment available- patient is agreeable to go to outside office- [facility] if opening. Patient advised he may be advised UC/virtual visit if no opening.

## 2021-05-24 NOTE — Telephone Encounter (Signed)
Reason for Disposition  Severe itching  Answer Assessment - Initial Assessment Questions 1. SYMPTOM: "What's the main symptom you're concerned about?" (e.g., discharge from penis, rash, pain, itching, swelling)     Irritation on penis 2. LOCATION: "Where is the irritation located?"     Shaft of penis 3. ONSET: "When did irritation  start?"     1-2 days ago 4. PAIN: "Is there any pain?" If Yes, ask: "How bad is it?"  (Scale 1-10; or mild, moderate, severe)     No pain- itching 5. URINE: "Any difficulty passing urine?" If Yes, ask: "When was the last time?"     no 6. CAUSE: "What do you think is causing the symptoms?"     History of yeast infection- possible friction burn 7. OTHER SYMPTOMS: "Do you have any other symptoms?" (e.g., fever, abdominal pain, blood in urine)     No other symptoms  Protocols used: Penis and Scrotum Symptoms-A-AH

## 2021-05-24 NOTE — Telephone Encounter (Signed)
Sent my chart message to patient advising him to go to an urgent care as [facility] nor [facility] could see him today.

## 2021-05-25 ENCOUNTER — Ambulatory Visit
Admission: RE | Admit: 2021-05-25 | Discharge: 2021-05-25 | Disposition: A | Discharge status: Home / Self Care | Payer: 59 | Source: Ambulatory Visit | Attending: Emergency Medicine | Admitting: Emergency Medicine

## 2021-05-25 ENCOUNTER — Other Ambulatory Visit: Payer: Self-pay

## 2021-05-25 VITALS — BP 164/86 | HR 87 | Temp 97.8°F | Resp 20

## 2021-05-25 DIAGNOSIS — R21 Rash and other nonspecific skin eruption: Secondary | ICD-10-CM | POA: Diagnosis present

## 2021-05-25 DIAGNOSIS — R030 Elevated blood-pressure reading, without diagnosis of hypertension: Secondary | ICD-10-CM | POA: Diagnosis not present

## 2021-05-25 MED ORDER — NYSTATIN 100000 UNIT/GM EX CREA: TOPICAL_CREAM | CUTANEOUS | 0 refills | Status: DC

## 2021-05-25 NOTE — ED Provider Notes (Signed)
Roderic Palau    CSN: [id number] Arrival date & time: 05/25/21  0807      History   Chief Complaint Chief Complaint  Patient presents with   Rash   Groin Pain    HPI John Murray is a 35 y.o. male.  Patient presents with a open rash on his penis which he attributes as a "friction rub" after having intercourse with his wife and masturbating.  He also reports a tingling sensation in his groin.  Treatment attempted at home with Lotrimin.  He denies penile discharge, testicular pain, abdominal pain, dysuria, hematuria, back pain, or other symptoms.  He reports he is monogamous and his relationship with his wife.  He denies pertinent medical history.  The history is provided by the patient.   Past Medical History:  Diagnosis Date   Eustachian tube dysfunction    History of cellulitis     Patient Active Problem List   Diagnosis Date Noted   Multiple lipomas 10/08/2016    Past Surgical History:  Procedure Laterality Date   WISDOM TOOTH EXTRACTION     2008       Home Medications    Prior to Admission medications   Medication Sig Start Date End Date Taking? Authorizing Provider  testosterone cypionate (DEPOTESTOSTERONE CYPIONATE) 200 MG/ML injection INJECT 0.75 CC EVERY 7 DAYS IM 04/03/21   McGowan, Larene Beach A, PA-C  NEEDLE, DISP, 18 G (BD DISP NEEDLES) 18G X 1-1/2" MISC 1 mg by Does not apply route every 14 (fourteen) days. 07/20/20   McGowan, Larene Beach A, PA-C  NEEDLE, DISP, 21 G (BD DISP NEEDLES) 21G X 1-1/2" MISC 1 mg by Does not apply route every 14 (fourteen) days. 07/20/20   Zara Council A, PA-C  nystatin cream (MYCOSTATIN) Apply to affected area 2 times daily 05/25/21   Sharion Balloon, NP  Syringe, Disposable, (2-3CC SYRINGE) 3 ML MISC 1 mg by Does not apply route every 14 (fourteen) days. 02/22/20   Nori Riis, PA-C    Family History Family History  Problem Relation Age of Onset   Prostate cancer Father    Healthy Son     Social History Social  History   Tobacco Use   Smoking status: Never   Smokeless tobacco: Current    Types: Chew  Substance Use Topics   Alcohol use: Yes    Comment: 1-2 occasionally    Drug use: No     Allergies   Patient has no known allergies.   Review of Systems Review of Systems  Constitutional:  Negative for chills and fever.  Respiratory:  Negative for cough and shortness of breath.   Cardiovascular:  Negative for chest pain and palpitations.  Gastrointestinal:  Negative for abdominal pain and vomiting.  Genitourinary:  Negative for dysuria, flank pain, hematuria, penile discharge and testicular pain.       Wound on penis.  Musculoskeletal:  Negative for arthralgias and back pain.  All other systems reviewed and are negative.   Physical Exam Triage Vital Signs ED Triage Vitals  Enc Vitals Group     BP      Pulse      Resp      Temp      Temp src      SpO2      Weight      Height      Head Circumference      Peak Flow      Pain Score  Pain Loc      Pain Edu?      Excl. in [place]?    No data found.  Updated Vital Signs BP (!) 164/86   Pulse 87   Temp 97.8 F (36.6 C) (Oral)   Resp 20   SpO2 100%   Visual Acuity Right Eye Distance:   Left Eye Distance:   Bilateral Distance:    Right Eye Near:   Left Eye Near:    Bilateral Near:     Physical Exam Vitals and nursing note reviewed.  Constitutional:      General: He is not in acute distress.    Appearance: He is well-developed. He is not ill-appearing.  HENT:     Head: Normocephalic and atraumatic.  Eyes:     Conjunctiva/sclera: Conjunctivae normal.  Cardiovascular:     Rate and Rhythm: Normal rate and regular rhythm.     Heart sounds: Normal heart sounds.  Pulmonary:     Effort: Pulmonary effort is normal. No respiratory distress.     Breath sounds: Normal breath sounds.  Abdominal:     Palpations: Abdomen is soft.     Tenderness: There is no abdominal tenderness.  Genitourinary:    Testes: Normal.        Comments: No penile discharge. Musculoskeletal:     Cervical back: Neck supple.  Skin:    General: Skin is warm and dry.  Neurological:     General: No focal deficit present.     Mental Status: He is alert and oriented to person, place, and time.     Gait: Gait normal.  Psychiatric:        Mood and Affect: Mood normal.        Behavior: Behavior normal.     UC Treatments / Results  Labs (all labs ordered are listed, but only abnormal results are displayed) Labs Reviewed  CYTOLOGY, (ORAL, ANAL, URETHRAL) ANCILLARY ONLY    EKG   Radiology No results found.  Procedures Procedures (including critical care time)  Medications Ordered in UC Medications - No data to display  Initial Impression / Assessment and Plan / UC Course  I have reviewed the triage vital signs and the nursing notes.  Pertinent labs & imaging results that were available during my care of the patient were reviewed by me and considered in my medical decision making (see chart for details).  Rash of penis, elevated blood pressure reading.  Treating with nystatin cream.  Patient declined HSV testing.  He did allow penile swab for GC, chlamydia, trichomonas.  Instructed patient to abstain from sexual activity until the test results are back.   Discussed with patient that his blood pressure is elevated today needs to be rechecked by his PCP in 2 to 4 weeks.  Education provided on preventing hypertension.  Patient agrees to plan of care.     Final Clinical Impressions(s) / UC Diagnoses   Final diagnoses:  Rash of penis  Elevated blood pressure reading     Discharge Instructions      Use the nystatin cream as directed.  Your test results should be back in the next 5 days.  You can see the results on your MyChart account but we will call you if anything is positive.  Do not have sexual activity until the test results are back.  Your blood pressure is elevated today at 164/86.  Please have this  rechecked by your primary care provider in 2-4 weeks.  ED Prescriptions     Medication Sig Dispense Auth. Provider   nystatin cream (MYCOSTATIN)  (Status: Discontinued) Apply to affected area 2 times daily 30 g Sharion Balloon, NP   nystatin cream (MYCOSTATIN) Apply to affected area 2 times daily 30 g Sharion Balloon, NP      PDMP not reviewed this encounter.   Sharion Balloon, NP 05/25/21 [phone]

## 2021-05-25 NOTE — ED Triage Notes (Signed)
Pt here after having intercourse with spouse and states lubrication issues. Pt states it feels like a friction burn, but also has strong tingling in groin area, but no other associated complaints. States lotrimin helps.

## 2021-05-25 NOTE — Discharge Instructions (Addendum)
Use the nystatin cream as directed.  Your test results should be back in the next 5 days.  You can see the results on your MyChart account but we will call you if anything is positive.  Do not have sexual activity until the test results are back.  Your blood pressure is elevated today at 164/86.  Please have this rechecked by your primary care provider in 2-4 weeks.

## 2021-05-28 LAB — CYTOLOGY, (ORAL, ANAL, URETHRAL) ANCILLARY ONLY
Chlamydia: NEGATIVE
Comment: NEGATIVE
Comment: NEGATIVE
Comment: NORMAL
Neisseria Gonorrhea: NEGATIVE
Trichomonas: NEGATIVE

## 2021-05-31 ENCOUNTER — Other Ambulatory Visit: Payer: Self-pay

## 2021-05-31 ENCOUNTER — Other Ambulatory Visit: Payer: 59

## 2021-05-31 DIAGNOSIS — E291 Testicular hypofunction: Secondary | ICD-10-CM

## 2021-06-01 LAB — HEMATOCRIT: Hematocrit: 54.3 % — ABNORMAL HIGH (ref 37.5–51.0)

## 2021-06-01 LAB — HEMOGLOBIN: Hemoglobin: 18.4 g/dL — ABNORMAL HIGH (ref 13.0–17.7)

## 2021-06-01 LAB — TESTOSTERONE: Testosterone: 867 ng/dL (ref 264–916)

## 2021-06-06 NOTE — Progress Notes (Signed)
06/07/21 1:19 PM   John Murray 09/08/1986 [id number]  Referring provider:  Trinna Post, PA-C No address on file  Chief Complaint  Patient presents with   Hypogonadism   Urological History:  1. Testosterone deficiency  - managed with testosterone cypionate '200mg'$ /mL, 0.75 cc every 7 days  - testosterone level 867 ng/dL in 05/31/2021 - HCT and HGB are elevated  - IPSS 1/0    2. Erectile dysfunction:  - SHIM 20   HPI: John Murray is a 35 y.o.male who presents today to discuss abnormal labs   He was recently seen in the ED on 05/25/2021 for an open rash on his penis which he attributed as a  "friction rub" after having intercourse with his wife and masturbating.  He also reported a tingling sensation in his groin.  Treatment attempted at home with Lotrimin. He was treated with nystatin cream.   His recent hematocrit level on 05/31/2021 was 54.3, and HGB was 18.4.  He is doing well today. He reports that has been having trouble with high blood pressure recently. He is worrisome about his blood pressure due to his state physical coming up. 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 He has also tried to give blood to balance out his levels and in order to do this his blood pressure needs to be under control. He reports today that he has been getting adequate sleep recently.     IPSS     Row Name 06/07/21 1300         International Prostate Symptom Score   How often have you had the sensation of not emptying your bladder? Not at All     How often have you had to urinate less than every two hours? Not at All     How often have you found you stopped and started again several times when you urinated? Not at All     How often have you found it difficult to postpone urination? Not at All     How often have you had a weak urinary stream? Not at All     How often have you had to strain to start urination? Not at All     How many times did you typically get up at night to  urinate? 1 Time     Total IPSS Score 1           Quality of Life due to urinary symptoms   If you were to spend the rest of your life with your urinary condition just the way it is now how would you feel about that? Delighted              Score:  1-7 Mild 8-19 Moderate 20-35 Severe    SHIM     Row Name 06/07/21 1311         SHIM: Over the last 6 months:   How do you rate your confidence that you could get and keep an erection? Moderate     When you had erections with sexual stimulation, how often were your erections hard enough for penetration (entering your partner)? Most Times (much more than half the time)     During sexual intercourse, how often were you able to maintain your erection after you had penetrated (entered) your partner? Most Times (much more than half the time)     During sexual intercourse, how difficult was it to maintain your erection to completion of intercourse? Not Difficult  When you attempted sexual intercourse, how often was it satisfactory for you? Most Times (much more than half the time)           SHIM Total Score   SHIM 20                PMH: Past Medical History:  Diagnosis Date   Eustachian tube dysfunction    History of cellulitis     Surgical History: Past Surgical History:  Procedure Laterality Date   WISDOM TOOTH EXTRACTION     2008    Home Medications:  Allergies as of 06/07/2021   No Known Allergies      Medication List        Accurate as of June 07, 2021  1:19 PM. If you have any questions, ask your nurse or doctor.          STOP taking these medications    nystatin cream Commonly known as: MYCOSTATIN Stopped by: Danely Bayliss, PA-C       TAKE these medications    2-3CC SYRINGE 3 ML Misc 1 mg by Does not apply route every 14 (fourteen) days.   BD Disp Needles 18G X 1-1/2" Misc Generic drug: NEEDLE (DISP) 18 G 1 mg by Does not apply route every 14 (fourteen) days.   BD Disp Needles  21G X 1-1/2" Misc Generic drug: NEEDLE (DISP) 21 G 1 mg by Does not apply route every 14 (fourteen) days.   testosterone cypionate 200 MG/ML injection Commonly known as: DEPOTESTOSTERONE CYPIONATE INJECT 0.75 CC EVERY 7 DAYS IM        Allergies: No Known Allergies  Family History: Family History  Problem Relation Age of Onset   Prostate cancer Father    Healthy Son     Social History:  reports that he has never smoked. His smokeless tobacco use includes chew. He reports current alcohol use. He reports that he does not use drugs.   Physical Exam: BP (!) 177/106   Pulse 76   Ht '5\' 9"'$  (1.753 m)   Wt 220 lb (99.8 kg)   BMI 32.49 kg/m   Constitutional:  Well nourished. Alert and oriented, No acute distress. HEENT:  AT, mask in place.  Trachea midline Cardiovascular: No clubbing, cyanosis, or edema. Respiratory: Normal respiratory effort, no increased work of breathing. GU: No CVA tenderness.  No bladder fullness or masses.  Patient with circumcised phallus.  Urethral meatus is patent.  No penile discharge. No penile lesions or rashes. Scrotum without lesions, cysts, rashes and/or edema.  Testicles are located scrotally bilaterally. No masses are appreciated in the testicles. Left and right epididymis are normal. Neurologic: Grossly intact, no focal deficits, moving all 4 extremities. Psychiatric: Normal mood and affect.   Laboratory Data: Component     Latest Ref Rng & Units 05/31/2021  HCT     37.5 - 51.0 % 54.3 (H)   Component     Latest Ref Rng & Units 05/31/2021  Hemoglobin     13.0 - 17.7 g/dL 18.4 (H)    Lab Results  Component Value Date   TESTOSTERONE 867 05/31/2021  I have reviewed the labs.     Assessment & Plan:    Testerone deficiency  - HGB elevated to 18.4  - HCT elevated to 54.3 - Cease testosterone cypionate due to elevated HCT and HGB x 2 months -if he is able to see his PCP and get his BP undercontrol, he can then donate blood in order to  balance  out his levels -he will call us after he donates blood (if able) if he can do this before his two months appointment  Follow-up in two months for testosterone HGB and [facility]   [facility] [address] [place], [place] [address] [phone]  I,Kailey Littlejohn,acting as a scribe for [facility], PA-C.,have documented all relevant documentation on the behalf of Merrell Rettinger, PA-C,as directed by  [facility], PA-C while in the presence of Jerlean Peralta, PA-C.  I have reviewed the above documentation for accuracy and completeness, and I agree with the above.    Zara Council, PA-C   I spent 30 minutes on the day of the encounter to include pre-visit record review, face-to-face time with the patient, and post-visit ordering of tests.

## 2021-06-07 ENCOUNTER — Other Ambulatory Visit: Payer: Self-pay

## 2021-06-07 ENCOUNTER — Encounter: Payer: Self-pay | Admitting: Urology

## 2021-06-07 ENCOUNTER — Ambulatory Visit: Payer: 59 | Admitting: Urology

## 2021-06-07 VITALS — BP 177/106 | HR 76 | Ht 69.0 in | Wt 220.0 lb

## 2021-06-07 DIAGNOSIS — E291 Testicular hypofunction: Secondary | ICD-10-CM

## 2021-06-08 ENCOUNTER — Ambulatory Visit: Payer: 59 | Admitting: Family Medicine

## 2021-06-08 ENCOUNTER — Encounter: Payer: Self-pay | Admitting: Family Medicine

## 2021-06-08 VITALS — BP 151/98 | HR 76 | Resp 16 | Wt 222.0 lb

## 2021-06-08 DIAGNOSIS — I10 Essential (primary) hypertension: Secondary | ICD-10-CM

## 2021-06-08 DIAGNOSIS — R7989 Other specified abnormal findings of blood chemistry: Secondary | ICD-10-CM

## 2021-06-08 DIAGNOSIS — E782 Mixed hyperlipidemia: Secondary | ICD-10-CM

## 2021-06-08 MED ORDER — LISINOPRIL 5 MG PO TABS: 5.0000 mg | ORAL_TABLET | Freq: Every day | ORAL | 3 refills | Status: DC

## 2021-06-08 NOTE — Progress Notes (Signed)
I,April Miller,acting as a Education administrator for [employer], PA-C.,have documented all relevant documentation on the behalf of Vernie Murders, PA-C,as directed by  [employer], PA-C while in the presence of [employer], PA-C.  Established patient visit   Patient: John Murray   DOB: 08-18-1986   35 y.o. Male  MRN: [id number] Visit Date: 06/08/2021  Today's healthcare provider: Vernie Murders, PA-C   Chief Complaint  Patient presents with   Hypertension   Subjective  -------------------------------------------------------------------------------------------------------------------- Hypertension This is a new problem. The current episode started 1 to 4 weeks ago (2 weeks). The problem has been waxing and waning since onset. The problem is uncontrolled. Pertinent negatives include no anxiety, blurred vision, chest pain, headaches, malaise/fatigue, neck pain, orthopnea, palpitations, peripheral edema, PND, shortness of breath or sweats. There are no associated agents to hypertension. Past treatments include nothing.     Patient had had intermittent elevated blood pressure for the past 2 weeks. Patient denies chest pain, chest tightness, palpitations, shortness of breath. Readings have been 177/106, 164/100, 170/98.  Past Medical History:  Diagnosis Date   Eustachian tube dysfunction    History of cellulitis    Past Surgical History:  Procedure Laterality Date   WISDOM TOOTH EXTRACTION     2008   Social History   Tobacco Use   Smoking status: Never   Smokeless tobacco: Current    Types: Chew  Substance Use Topics   Alcohol use: Yes    Comment: 1-2 occasionally    Drug use: No   Family Status  Relation Name Status   Mother  Alive   Father  Alive   Sister  Alive   Brother  31   Son  Alive   Sister  Alive   No Known Allergies    Medications: Outpatient Medications Prior to Visit  Medication Sig   Multiple Vitamin (MULTIVITAMIN PO) Take by mouth.    NEEDLE, DISP, 18 G (BD DISP NEEDLES) 18G X 1-1/2" MISC 1 mg by Does not apply route every 14 (fourteen) days.   NEEDLE, DISP, 21 G (BD DISP NEEDLES) 21G X 1-1/2" MISC 1 mg by Does not apply route every 14 (fourteen) days.   Omega-3 Fatty Acids (FISH OIL PO) Take by mouth.   Syringe, Disposable, (2-3CC SYRINGE) 3 ML MISC 1 mg by Does not apply route every 14 (fourteen) days.   testosterone cypionate (DEPOTESTOSTERONE CYPIONATE) 200 MG/ML injection INJECT 0.75 CC EVERY 7 DAYS IM (Patient not taking: Reported on 06/08/2021)   No facility-administered medications prior to visit.    Review of Systems  Constitutional:  Negative for appetite change, chills, fever and malaise/fatigue.  Eyes:  Negative for blurred vision.  Respiratory:  Negative for chest tightness, shortness of breath and wheezing.   Cardiovascular:  Negative for chest pain, palpitations, orthopnea and PND.  Gastrointestinal:  Negative for abdominal pain, nausea and vomiting.  Musculoskeletal:  Negative for neck pain.  Neurological:  Negative for headaches.   Last CBC Lab Results  Component Value Date   HGB 18.4 (H) 05/31/2021   HCT 54.3 (H) XX123456   Last metabolic panel No results found for: GLUCOSE, NA, K, CL, CO2, BUN, CREATININE, GFRNONAA, GFRAA, CALCIUM, PHOS, PROT, ALBUMIN, LABGLOB, AGRATIO, BILITOT, ALKPHOS, AST, ALT, ANIONGAP Last lipids Lab Results  Component Value Date   CHOL 206 (H) 01/20/2020   HDL 33 (L) 01/20/2020   LDLCALC 123 (H) 01/20/2020   TRIG 281 (H) 01/20/2020   CHOLHDL 6.2 (H) 01/20/2020  Last thyroid functions No results found for: TSH, T3TOTAL, T4TOTAL, THYROIDAB     Objective  -------------------------------------------------------------------------------------------------------------------- BP (!) 151/98 (BP Location: Right Arm, Patient Position: Sitting, Cuff Size: Large)   Pulse 76   Resp 16   Wt 222 lb (100.7 kg)   SpO2 98%   BMI 32.78 kg/m  BP Readings from Last 3 Encounters:   06/08/21 (!) 151/98  06/07/21 (!) 177/106  05/25/21 (!) 164/86   Wt Readings from Last 3 Encounters:  06/08/21 222 lb (100.7 kg)  06/07/21 220 lb (99.8 kg)  10/31/20 220 lb (99.8 kg)      Physical Exam    No results found for any visits on 06/08/21.  Assessment & Plan  ---------------------------------------------------------------------------------------------------------------------- 1. Hypertension, unspecified type BP 170/100 at home and other areas in the past several weeks. Suspect secondary to elevated Hgb and Hct from testosterone injections. Will start Lisinopril and check routine labs. - lisinopril (ZESTRIL) 5 MG tablet; Take 1 tablet (5 mg total) by mouth daily.  Dispense: 90 tablet; Refill: 3 - CBC with Differential/Platelet - Comprehensive metabolic panel - Lipid panel - TSH  2. Low testosterone Followed by Dr. Ernestine Conrad (urologist). Has stopped the injections until November. May be the cause of BP elevation. Check labs. - lisinopril (ZESTRIL) 5 MG tablet; Take 1 tablet (5 mg total) by mouth daily.  Dispense: 90 tablet; Refill: 3 - CBC with Differential/Platelet - Comprehensive metabolic panel  3. Mixed hyperlipidemia History of elevated cholesterol and triglycerides. Recheck levels. - CBC with Differential/Platelet - Comprehensive metabolic panel - Lipid panel - TSH   No follow-ups on file.      I, Mert Dietrick, PA-C, have reviewed all documentation for this visit. The documentation on 06/08/21 for the exam, diagnosis, procedures, and orders are all accurate and complete.    Vernie Murders, PA-C  [employer] [phone] (phone) [phone] (fax)  [place]

## 2021-06-16 ENCOUNTER — Encounter: Payer: Self-pay | Admitting: Family Medicine

## 2021-06-16 LAB — COMPREHENSIVE METABOLIC PANEL
ALT: 31 IU/L (ref 0–44)
AST: 24 IU/L (ref 0–40)
Albumin/Globulin Ratio: 2.5 — ABNORMAL HIGH (ref 1.2–2.2)
Albumin: 5 g/dL (ref 4.0–5.0)
Alkaline Phosphatase: 57 IU/L (ref 44–121)
BUN/Creatinine Ratio: 16 (ref 9–20)
BUN: 18 mg/dL (ref 6–20)
Bilirubin Total: 1.5 mg/dL — ABNORMAL HIGH (ref 0.0–1.2)
CO2: 21 mmol/L (ref 20–29)
Calcium: 9.7 mg/dL (ref 8.7–10.2)
Chloride: 100 mmol/L (ref 96–106)
Creatinine, Ser: 1.12 mg/dL (ref 0.76–1.27)
Globulin, Total: 2 g/dL (ref 1.5–4.5)
Glucose: 97 mg/dL (ref 65–99)
Potassium: 4.5 mmol/L (ref 3.5–5.2)
Sodium: 140 mmol/L (ref 134–144)
Total Protein: 7 g/dL (ref 6.0–8.5)
eGFR: 87 mL/min/{1.73_m2} (ref >59)

## 2021-06-16 LAB — CBC WITH DIFFERENTIAL/PLATELET
Basophils Absolute: 0 10*3/uL (ref 0.0–0.2)
Basos: 1 %
EOS (ABSOLUTE): 0.1 10*3/uL (ref 0.0–0.4)
Eos: 1 %
Hematocrit: 50.5 % (ref 37.5–51.0)
Hemoglobin: 17.9 g/dL — ABNORMAL HIGH (ref 13.0–17.7)
Immature Grans (Abs): 0 10*3/uL (ref 0.0–0.1)
Immature Granulocytes: 1 %
Lymphocytes Absolute: 1.8 10*3/uL (ref 0.7–3.1)
Lymphs: 25 %
MCH: 29.8 pg (ref 26.6–33.0)
MCHC: 35.4 g/dL (ref 31.5–35.7)
MCV: 84 fL (ref 79–97)
Monocytes Absolute: 0.5 10*3/uL (ref 0.1–0.9)
Monocytes: 7 %
Neutrophils Absolute: 4.9 10*3/uL (ref 1.4–7.0)
Neutrophils: 65 %
Platelets: 229 10*3/uL (ref 150–450)
RBC: 6 x10E6/uL — ABNORMAL HIGH (ref 4.14–5.80)
RDW: 12.7 % (ref 11.6–15.4)
WBC: 7.4 10*3/uL (ref 3.4–10.8)

## 2021-06-16 LAB — LIPID PANEL
Chol/HDL Ratio: 5.7 ratio — ABNORMAL HIGH (ref 0.0–5.0)
Cholesterol, Total: 232 mg/dL — ABNORMAL HIGH (ref 100–199)
HDL: 41 mg/dL (ref >39)
LDL Chol Calc (NIH): 150 mg/dL — ABNORMAL HIGH (ref 0–99)
Triglycerides: 222 mg/dL — ABNORMAL HIGH (ref 0–149)
VLDL Cholesterol Cal: 41 mg/dL — ABNORMAL HIGH (ref 5–40)

## 2021-06-16 LAB — TSH: TSH: 0.527 u[IU]/mL (ref 0.450–4.500)

## 2021-06-25 ENCOUNTER — Other Ambulatory Visit: Payer: 59

## 2021-06-25 ENCOUNTER — Other Ambulatory Visit: Payer: Self-pay

## 2021-06-27 ENCOUNTER — Other Ambulatory Visit: Payer: Self-pay

## 2021-06-27 ENCOUNTER — Other Ambulatory Visit: Payer: 59

## 2021-06-27 DIAGNOSIS — E291 Testicular hypofunction: Secondary | ICD-10-CM

## 2021-06-28 ENCOUNTER — Other Ambulatory Visit: Payer: Self-pay

## 2021-06-28 ENCOUNTER — Other Ambulatory Visit: Payer: Self-pay | Admitting: Urology

## 2021-06-28 DIAGNOSIS — R7989 Other specified abnormal findings of blood chemistry: Secondary | ICD-10-CM

## 2021-06-28 DIAGNOSIS — I10 Essential (primary) hypertension: Secondary | ICD-10-CM

## 2021-06-28 LAB — HEMOGLOBIN AND HEMATOCRIT, BLOOD
Hematocrit: 46.9 % (ref 37.5–51.0)
Hemoglobin: 16.2 g/dL (ref 13.0–17.7)

## 2021-06-28 MED ORDER — LISINOPRIL 10 MG PO TABS: 5.0000 mg | ORAL_TABLET | Freq: Every day | ORAL | 0 refills | Status: DC

## 2021-06-29 ENCOUNTER — Ambulatory Visit: Payer: 59 | Admitting: Family Medicine

## 2021-07-09 ENCOUNTER — Encounter: Payer: 59 | Admitting: Family Medicine

## 2021-07-12 ENCOUNTER — Ambulatory Visit: Payer: 59 | Admitting: Family Medicine

## 2021-07-12 ENCOUNTER — Other Ambulatory Visit: Payer: Self-pay

## 2021-07-12 ENCOUNTER — Encounter: Payer: Self-pay | Admitting: Family Medicine

## 2021-07-12 VITALS — BP 146/88 | HR 89 | Wt 212.0 lb

## 2021-07-12 DIAGNOSIS — D179 Benign lipomatous neoplasm, unspecified: Secondary | ICD-10-CM | POA: Diagnosis not present

## 2021-07-12 DIAGNOSIS — I10 Essential (primary) hypertension: Secondary | ICD-10-CM | POA: Diagnosis not present

## 2021-07-12 DIAGNOSIS — R7989 Other specified abnormal findings of blood chemistry: Secondary | ICD-10-CM

## 2021-07-12 DIAGNOSIS — Z23 Encounter for immunization: Secondary | ICD-10-CM

## 2021-07-12 MED ORDER — AMLODIPINE BESYLATE 5 MG PO TABS: 5.0000 mg | ORAL_TABLET | Freq: Every day | ORAL | 1 refills | Status: DC

## 2021-07-12 MED ORDER — LISINOPRIL 10 MG PO TABS: 10.0000 mg | ORAL_TABLET | Freq: Every day | ORAL | 1 refills | Status: DC

## 2021-07-12 NOTE — Progress Notes (Signed)
Established patient visit   Patient: John Murray   DOB: 08-08-1986   35 y.o. 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  He comes in today for follow-up.  Blood pressures are running higher recently.  He feels well and has no complaints. He does have several lumps on him that have been there for years.  1 on each arm and 2 on his trunk. Hypertension, follow-up  BP Readings from Last 3 Encounters:  06/08/21 (!) 151/98  06/07/21 (!) 177/106  05/25/21 (!) 164/86   Wt Readings from Last 3 Encounters:  06/08/21 222 lb (100.7 kg)  06/07/21 220 lb (99.8 kg)  10/31/20 220 lb (99.8 kg)     He was last seen for hypertension 1  month  ago.  BP at that visit was 151/98. Management since that visit includes starting lisinopril 30m. DSimona Huhhad him increase to 183mdaily.   He reports excellent compliance with treatment. He is not having side effects.  He is following a Low Sodium diet. He is exercising. He does not smoke.  Use of agents associated with hypertension: none.   Outside blood pressures are 120's/ 80's. Symptoms: No chest pain No chest pressure  No palpitations No syncope  No dyspnea No orthopnea  No paroxysmal nocturnal dyspnea No lower extremity edema   Pertinent labs: Lab Results  Component Value Date   CHOL 232 (H) 06/15/2021   HDL 41 06/15/2021   LDLCALC 150 (H) 06/15/2021   TRIG 222 (H) 06/15/2021   CHOLHDL 5.7 (H) 06/15/2021   Lab Results  Component Value Date   NA 140 06/15/2021   K 4.5 06/15/2021   CREATININE 1.12 06/15/2021   EGFR 87 06/15/2021   GLUCOSE 97 06/15/2021     The ASCVD Risk score (Arnett DK, et al., 2019) failed to calculate for the following reasons:   The 2019 ASCVD risk score is only valid for  ages 4054o 7922 ---------------------------------------------------------------------------------------------------     Medications: Outpatient Medications Prior to Visit  Medication Sig   lisinopril (ZESTRIL) 10 MG tablet Take 0.5 tablets (5 mg total) by mouth daily.   Multiple Vitamin (MULTIVITAMIN PO) Take by mouth.   NEEDLE, DISP, 18 G (BD DISP NEEDLES) 18G X 1-1/2" MISC 1 mg by Does not apply route every 14 (fourteen) days.   NEEDLE, DISP, 21 G (BD DISP NEEDLES) 21G X 1-1/2" MISC 1 mg by Does not apply route every 14 (fourteen) days.   Omega-3 Fatty Acids (FISH OIL PO) Take by mouth.   Syringe, Disposable, (2-3CC SYRINGE) 3 ML MISC 1 mg by Does not apply route every 14 (fourteen) days.   testosterone cypionate (DEPOTESTOSTERONE CYPIONATE) 200 MG/ML injection INJECT 0.75 CC EVERY 7 DAYS INTO MUSCLE   No facility-administered medications prior to visit.    Review of Systems  Constitutional: Negative.   Respiratory: Negative.    Cardiovascular: Negative.   Gastrointestinal: Negative.   Neurological:  Positive for light-headedness. Negative for dizziness and headaches.      Objective    There were no vitals taken for this visit.   Physical Exam Vitals reviewed.  Constitutional:      General: He is not in acute distress.  Appearance: He is well-developed.  HENT:     Head: Normocephalic and atraumatic.     Right Ear: Hearing normal.     Left Ear: Hearing normal.     Nose: Nose normal.  Eyes:     General: Lids are normal. No scleral icterus.       Right eye: No discharge.        Left eye: No discharge.     Conjunctiva/sclera: Conjunctivae normal.  Cardiovascular:     Rate and Rhythm: Normal rate and regular rhythm.     Heart sounds: Normal heart sounds.  Pulmonary:     Effort: Pulmonary effort is normal. No respiratory distress.  Skin:    General: Skin is warm and dry.     Findings: No lesion or rash.     Comments: There are small lipomas on each arm and 2 on  his trunk.  These appear to be completely benign.  Neurological:     General: No focal deficit present.     Mental Status: He is alert and oriented to person, place, and time.  Psychiatric:        Mood and Affect: Mood normal.        Speech: Speech normal.        Behavior: Behavior normal.        Thought Content: Thought content normal.        Judgment: Judgment normal.      No results found for any visits on 07/12/21.  Assessment & Plan     1. Low testosterone Followed by urology. - lisinopril (ZESTRIL) 10 MG tablet; Take 1 tablet (10 mg total) by mouth daily.  Dispense: 90 tablet; Refill: 1  2. Hypertension, unspecified type And amlodipine and follow-up at the first of the year.  He will check blood pressures at home and at work. - lisinopril (ZESTRIL) 10 MG tablet; Take 1 tablet (10 mg total) by mouth daily.  Dispense: 90 tablet; Refill: 1  3. Need for influenza vaccination  - Flu Vaccine QUAD 6+ mos PF IM (Fluarix Quad PF) 4.  Multiple lipomas  No follow-ups on file.      I, Wilhemena Durie, MD, have reviewed all documentation for this visit. The documentation on 07/14/21 for the exam, diagnosis, procedures, and orders are all accurate and complete.    Anarie Kalish Cranford Mon, MD  [facility] [phone] (phone) [phone] (fax)  [place]

## 2021-07-29 ENCOUNTER — Other Ambulatory Visit: Payer: Self-pay

## 2021-07-30 ENCOUNTER — Other Ambulatory Visit: Payer: Self-pay | Admitting: *Deleted

## 2021-07-30 DIAGNOSIS — I10 Essential (primary) hypertension: Secondary | ICD-10-CM

## 2021-07-30 MED ORDER — BD DISP NEEDLES 21G X 1-1/2" MISC
1.0000 mg | 0 refills | Status: DC
Start: 2021-07-30 — End: 2021-12-03

## 2021-07-30 MED ORDER — "BD DISP NEEDLES 18G X 1-1/2"" MISC": 1.0000 mg | 0 refills | Status: DC

## 2021-07-30 MED ORDER — LISINOPRIL 40 MG PO TABS: 40.0000 mg | ORAL_TABLET | Freq: Every day | ORAL | 3 refills | Status: DC

## 2021-08-07 ENCOUNTER — Other Ambulatory Visit: Payer: 59

## 2021-08-09 ENCOUNTER — Other Ambulatory Visit: Payer: 59

## 2021-09-28 ENCOUNTER — Other Ambulatory Visit: Payer: Self-pay | Admitting: Urology

## 2021-09-30 ENCOUNTER — Other Ambulatory Visit: Payer: Self-pay | Admitting: Urology

## 2021-10-02 ENCOUNTER — Other Ambulatory Visit: Payer: Self-pay | Admitting: Urology

## 2021-10-02 DIAGNOSIS — E291 Testicular hypofunction: Secondary | ICD-10-CM

## 2021-10-15 ENCOUNTER — Other Ambulatory Visit: Payer: Self-pay

## 2021-10-15 DIAGNOSIS — E291 Testicular hypofunction: Secondary | ICD-10-CM

## 2021-10-16 ENCOUNTER — Other Ambulatory Visit: Payer: Self-pay

## 2021-10-16 ENCOUNTER — Other Ambulatory Visit: Payer: 59

## 2021-10-16 DIAGNOSIS — E291 Testicular hypofunction: Secondary | ICD-10-CM

## 2021-10-16 NOTE — Telephone Encounter (Signed)
Pt is completely out of meds for 2 weeks now.  He came in for labs today.  He uses [facility] on [address].   Also, last year he was given 20 syringes of each and he only needed the 18 tip and 21 for injection.  They are only giving him 1 now instead of 4 per month.

## 2021-10-17 LAB — HEMOGLOBIN AND HEMATOCRIT, BLOOD
Hematocrit: 49.2 % (ref 37.5–51.0)
Hemoglobin: 16.6 g/dL (ref 13.0–17.7)

## 2021-10-17 LAB — TESTOSTERONE: Testosterone: 118 ng/dL — ABNORMAL LOW (ref 264–916)

## 2021-11-12 ENCOUNTER — Encounter: Payer: Self-pay | Admitting: Family Medicine

## 2021-11-12 ENCOUNTER — Other Ambulatory Visit: Payer: Self-pay

## 2021-11-12 ENCOUNTER — Ambulatory Visit: Payer: 59 | Admitting: Family Medicine

## 2021-11-12 VITALS — BP 127/80 | HR 64 | Temp 98.5°F | Resp 16 | Wt 220.9 lb

## 2021-11-12 DIAGNOSIS — M25511 Pain in right shoulder: Secondary | ICD-10-CM | POA: Diagnosis not present

## 2021-11-12 DIAGNOSIS — R7989 Other specified abnormal findings of blood chemistry: Secondary | ICD-10-CM

## 2021-11-12 DIAGNOSIS — I10 Essential (primary) hypertension: Secondary | ICD-10-CM | POA: Diagnosis not present

## 2021-11-12 MED ORDER — MELOXICAM 15 MG PO TABS: 15.0000 mg | ORAL_TABLET | Freq: Every day | ORAL | 3 refills | Status: DC | PRN

## 2021-11-12 NOTE — Progress Notes (Signed)
°  ° ° °Established patient visit ° ° °Patient: John Murray   DOB: 08/06/1986   36 y.o. Male  MRN: [id number] °Visit Date: 11/12/2021 ° °Today's healthcare provider: Richard Gilbert Jr, MD  ° °Chief Complaint  °Patient presents with  ° Hypertension  ° °Subjective  °  °Shoulder Pain  °The pain is present in the right shoulder. This is a recurrent problem. The current episode started more than 1 year ago. The problem occurs intermittently. The quality of the pain is described as aching. Associated symptoms include tingling. Pertinent negatives include no fever, inability to bear weight, itching, joint locking, joint swelling, numbness or stiffness. The symptoms are aggravated by activity. He has tried NSAIDS for the symptoms. The treatment provided no relief. There is no history of diabetes, gout, osteoarthritis or rheumatoid arthritis.   °Hypertension, follow-up ° °BP Readings from Last 3 Encounters:  °11/12/21 127/80  °07/12/21 (!) 146/88  °06/08/21 (!) 151/98  ° Wt Readings from Last 3 Encounters:  °11/12/21 220 lb 14.4 oz (100.2 kg)  °07/12/21 212 lb (96.2 kg)  °06/08/21 222 lb (100.7 kg)  °  ° °He was last seen for hypertension 3 months ago.  °BP at that visit was 146/88. Management since that visit includes increasing Lisinopril to 10mg and d/c Amlodipine. ° °He reports excellent compliance with treatment. °He is not having side effects.  °He is following a Regular diet. °He is exercising. °He does not smoke. ° °Use of agents associated with hypertension: none.  ° °Outside blood pressures are not checked. °Symptoms: °No chest pain No chest pressure  °No palpitations No syncope  °No dyspnea No orthopnea  °No paroxysmal nocturnal dyspnea No lower extremity edema  ° °Pertinent labs: °Lab Results  °Component Value Date  ° CHOL 232 (H) 06/15/2021  ° HDL 41 06/15/2021  ° LDLCALC 150 (H) 06/15/2021  ° TRIG 222 (H) 06/15/2021  ° CHOLHDL 5.7 (H) 06/15/2021  ° Lab Results  °Component Value Date  ° NA 140 06/15/2021  ° K  4.5 06/15/2021  ° CREATININE 1.12 06/15/2021  ° EGFR 87 06/15/2021  ° GLUCOSE 97 06/15/2021  ° TSH 0.527 06/15/2021  °  ° °The ASCVD Risk score (Arnett DK, et al., 2019) failed to calculate for the following reasons: °  The 2019 ASCVD risk score is only valid for ages 40 to 79  ° ° °Medications: °Outpatient Medications Prior to Visit  °Medication Sig  ° lisinopril (ZESTRIL) 40 MG tablet Take 1 tablet (40 mg total) by mouth daily.  ° Multiple Vitamin (MULTIVITAMIN PO) Take by mouth.  ° NEEDLE, DISP, 18 G (BD DISP NEEDLES) 18G X 1-1/2" MISC 1 mg by Does not apply route every 14 (fourteen) days.  ° NEEDLE, DISP, 21 G (BD DISP NEEDLES) 21G X 1-1/2" MISC 1 mg by Does not apply route every 14 (fourteen) days.  ° Syringe, Disposable, (2-3CC SYRINGE) 3 ML MISC 1 mg by Does not apply route every 14 (fourteen) days.  ° testosterone cypionate (DEPOTESTOSTERONE CYPIONATE) 200 MG/ML injection INJECT 0.5 CC EVERY 7 DAYS INTO MUSCLE  ° [DISCONTINUED] amLODipine (NORVASC) 5 MG tablet Take 1 tablet (5 mg total) by mouth daily.  ° [DISCONTINUED] Omega-3 Fatty Acids (FISH OIL PO) Take by mouth.  ° °No facility-administered medications prior to visit.  ° ° °Review of Systems  °Constitutional:  Negative for fever.  °Musculoskeletal:  Negative for gout and stiffness.  °Skin:  Negative for itching.  °Neurological:  Positive for tingling. Negative for numbness.  ° °  °    Objective  °  °BP 127/80    Pulse 64    Temp 98.5 °F (36.9 °C) (Oral)    Resp 16    Wt 220 lb 14.4 oz (100.2 kg)    SpO2 100%    BMI 32.62 kg/m²  °BP Readings from Last 3 Encounters:  °11/12/21 127/80  °07/12/21 (!) 146/88  °06/08/21 (!) 151/98  ° °Wt Readings from Last 3 Encounters:  °11/12/21 220 lb 14.4 oz (100.2 kg)  °07/12/21 212 lb (96.2 kg)  °06/08/21 222 lb (100.7 kg)  ° °  ° °Physical Exam °Vitals reviewed.  °Constitutional:   °   General: He is not in acute distress. °   Appearance: He is well-developed.  °HENT:  °   Head: Normocephalic and atraumatic.  °   Right  Ear: Hearing normal.  °   Left Ear: Hearing normal.  °   Nose: Nose normal.  °Eyes:  °   General: Lids are normal. No scleral icterus.    °   Right eye: No discharge.     °   Left eye: No discharge.  °   Conjunctiva/sclera: Conjunctivae normal.  °Cardiovascular:  °   Rate and Rhythm: Normal rate and regular rhythm.  °   Heart sounds: Normal heart sounds.  °Pulmonary:  °   Effort: Pulmonary effort is normal. No respiratory distress.  °Musculoskeletal:     °   General: No swelling. Normal range of motion.  °   Comments: Some mild anterior tenderness possibly a biceps tendinitis.  °Skin: °   Findings: No lesion or rash.  °Neurological:  °   General: No focal deficit present.  °   Mental Status: He is alert and oriented to person, place, and time.  °Psychiatric:     °   Mood and Affect: Mood normal.     °   Speech: Speech normal.     °   Behavior: Behavior normal.     °   Thought Content: Thought content normal.     °   Judgment: Judgment normal.  °  ° ° °No results found for any visits on 11/12/21. ° Assessment & Plan  °  ° °1. Acute pain of right shoulder °This is a tendinitis versus rotator cuff arthropathy.  Today we will treatment with meloxicam daily for a month but we will go ahead and refer to sports medicine for evaluation if not better in a few weeks. °- Ambulatory referral to Orthopedics ° °2. Hypertension, unspecified type °Good control on increased dose of lisinopril and off of amlodipine. °We will see him back for CPE this summer or fall. ° °3. Low testosterone °Followed by urology. ° ° °No follow-ups on file.  °   ° °I, Richard Gilbert Jr, MD, have reviewed all documentation for this visit. The documentation on 11/12/21 for the exam, diagnosis, procedures, and orders are all accurate and complete. ° °Patient seen and examined by Dr. Richard Gilbert, note scribed by Kathleen Wolford, NCMA  ° °Richard Gilbert Jr, MD  °[facility] °[phone] (phone) °[phone] (fax) ° °[facility] °

## 2021-12-03 ENCOUNTER — Other Ambulatory Visit: Payer: Self-pay | Admitting: Urology

## 2021-12-03 MED ORDER — SYRINGE 2-3 ML 3 ML MISC: 1.0000 mg | 3 refills | Status: DC

## 2021-12-03 MED ORDER — "BD DISP NEEDLES 18G X 1-1/2"" MISC": 1.0000 mg | 0 refills | Status: DC

## 2021-12-03 MED ORDER — "BD DISP NEEDLES 21G X 1-1/2"" MISC": 1.0000 mg | 0 refills | Status: DC

## 2022-01-16 ENCOUNTER — Other Ambulatory Visit: Payer: Self-pay

## 2022-01-16 DIAGNOSIS — E291 Testicular hypofunction: Secondary | ICD-10-CM

## 2022-01-17 ENCOUNTER — Other Ambulatory Visit: Payer: 59

## 2022-01-17 DIAGNOSIS — E291 Testicular hypofunction: Secondary | ICD-10-CM

## 2022-01-18 LAB — TESTOSTERONE: Testosterone: 653 ng/dL (ref 264–916)

## 2022-01-18 LAB — HEMOGLOBIN AND HEMATOCRIT, BLOOD
Hematocrit: 45.4 % (ref 37.5–51.0)
Hemoglobin: 15.5 g/dL (ref 13.0–17.7)

## 2022-04-11 ENCOUNTER — Other Ambulatory Visit: Payer: Self-pay

## 2022-04-11 DIAGNOSIS — E291 Testicular hypofunction: Secondary | ICD-10-CM

## 2022-04-15 ENCOUNTER — Other Ambulatory Visit: Payer: 59

## 2022-04-15 ENCOUNTER — Other Ambulatory Visit: Payer: Self-pay | Admitting: Urology

## 2022-04-15 ENCOUNTER — Encounter: Payer: Self-pay | Admitting: Family Medicine

## 2022-04-15 ENCOUNTER — Ambulatory Visit (INDEPENDENT_AMBULATORY_CARE_PROVIDER_SITE_OTHER): Payer: 59 | Admitting: Family Medicine

## 2022-04-15 VITALS — BP 138/88 | HR 76 | Resp 14 | Ht 69.5 in | Wt 211.0 lb

## 2022-04-15 DIAGNOSIS — Z1159 Encounter for screening for other viral diseases: Secondary | ICD-10-CM

## 2022-04-15 DIAGNOSIS — Z114 Encounter for screening for human immunodeficiency virus [HIV]: Secondary | ICD-10-CM | POA: Diagnosis not present

## 2022-04-15 DIAGNOSIS — Z0000 Encounter for general adult medical examination without abnormal findings: Secondary | ICD-10-CM

## 2022-04-15 DIAGNOSIS — I10 Essential (primary) hypertension: Secondary | ICD-10-CM

## 2022-04-15 DIAGNOSIS — M25511 Pain in right shoulder: Secondary | ICD-10-CM

## 2022-04-15 DIAGNOSIS — E782 Mixed hyperlipidemia: Secondary | ICD-10-CM

## 2022-04-15 DIAGNOSIS — E291 Testicular hypofunction: Secondary | ICD-10-CM

## 2022-04-15 DIAGNOSIS — R7989 Other specified abnormal findings of blood chemistry: Secondary | ICD-10-CM

## 2022-04-15 NOTE — Progress Notes (Signed)
Complete physical exam  I,April Miller,acting as a scribe for Wilhemena Durie, MD.,have documented all relevant documentation on the behalf of Wilhemena Durie, MD,as directed by  Wilhemena Durie, MD while in the presence of Wilhemena Durie, MD.   Patient: John Murray   DOB: [birth year]   36 y.o. Male  MRN: [id number] Visit Date: 04/15/2022  Today's healthcare provider: Wilhemena Durie, MD   Chief Complaint  Patient presents with   Annual Exam   Subjective    John Murray is a 36 y.o. male who presents today for a complete physical exam.  He reports consuming a general diet. Home exercise routine includes gym. He generally feels well. He reports sleeping fairly well. He does not have additional problems to discuss today.  He is married and is a father of 3.  He does dip tobacco. HPI    Past Medical History:  Diagnosis Date   Eustachian tube dysfunction    History of cellulitis    Past Surgical History:  Procedure Laterality Date   WISDOM TOOTH EXTRACTION     2008   Social History   Socioeconomic History   Marital status: Married    Spouse name: Not on file   Number of children: Not on file   Years of education: Not on file   Highest education level: Not on file  Occupational History   Not on file  Tobacco Use   Smoking status: Never   Smokeless tobacco: Current    Types: Chew  Substance and Sexual Activity   Alcohol use: Yes    Comment: 1-2 occasionally    Drug use: No   Sexual activity: Not Currently  Other Topics Concern   Not on file  Social History Narrative   Not on file   Social Determinants of Health   Financial Resource Strain: Not on file  Food Insecurity: Not on file  Transportation Needs: Not on file  Physical Activity: Not on file  Stress: Not on file  Social Connections: Not on file  Intimate Partner Violence: Not on file   Family Status  Relation Name Status   Mother  Alive   Father  Alive   Sister  Alive    Brother  Alive   Son  Alive   Sister  Alive   Family History  Problem Relation Age of Onset   Prostate cancer Father    Healthy Son    No Known Allergies  Patient Care Team: Jerrol Banana., MD as PCP - General (Family Medicine)   Medications: Outpatient Medications Prior to Visit  Medication Sig   lisinopril (ZESTRIL) 40 MG tablet Take 1 tablet (40 mg total) by mouth daily.   Multiple Vitamin (MULTIVITAMIN PO) Take by mouth.   NEEDLE, DISP, 18 G (BD DISP NEEDLES) 18G X 1-1/2" MISC 1 mg by Does not apply route every 14 (fourteen) days.   NEEDLE, DISP, 21 G (BD DISP NEEDLES) 21G X 1-1/2" MISC 1 mg by Does not apply route every 14 (fourteen) days.   Syringe, Disposable, (2-3CC SYRINGE) 3 ML MISC 1 mg by Does not apply route every 14 (fourteen) days.   testosterone cypionate (DEPOTESTOSTERONE CYPIONATE) 200 MG/ML injection INJECT 0.5 CC EVERY 7 DAYS INTO MUSCLE   [DISCONTINUED] meloxicam (MOBIC) 15 MG tablet Take 1 tablet (15 mg total) by mouth daily as needed for pain.   No facility-administered medications prior to visit.    Review of Systems  Musculoskeletal:  Positive for arthralgias and back pain.  All other systems reviewed and are negative.   Last metabolic panel Lab Results  Component Value Date   GLUCOSE 97 06/15/2021   NA 140 06/15/2021   K 4.5 06/15/2021   CL 100 06/15/2021   CO2 21 06/15/2021   BUN 18 06/15/2021   CREATININE 1.12 06/15/2021   EGFR 87 06/15/2021   CALCIUM 9.7 06/15/2021   PROT 7.0 06/15/2021   ALBUMIN 5.0 06/15/2021   LABGLOB 2.0 06/15/2021   AGRATIO 2.5 (H) 06/15/2021   BILITOT 1.5 (H) 06/15/2021   ALKPHOS 57 06/15/2021   AST 24 06/15/2021   ALT 31 06/15/2021      Objective     BP 138/88 (BP Location: Right Arm, Patient Position: Sitting, Cuff Size: Large)   Pulse 76   Resp 14   Ht 5' 9.5" (1.765 m)   Wt 211 lb (95.7 kg)   SpO2 99%   BMI 30.71 kg/m  BP Readings from Last 3 Encounters:  04/15/22 138/88  11/12/21 127/80   07/12/21 (!) 146/88   Wt Readings from Last 3 Encounters:  04/15/22 211 lb (95.7 kg)  11/12/21 220 lb 14.4 oz (100.2 kg)  07/12/21 212 lb (96.2 kg)       Physical Exam Constitutional:      Appearance: Normal appearance. He is normal weight.  HENT:     Head: Normocephalic and atraumatic.     Right Ear: Tympanic membrane, ear canal and external ear normal.     Left Ear: Tympanic membrane, ear canal and external ear normal.     Nose: Nose normal.     Mouth/Throat:     Mouth: Mucous membranes are moist.     Pharynx: Oropharynx is clear.  Eyes:     Extraocular Movements: Extraocular movements intact.     Conjunctiva/sclera: Conjunctivae normal.     Pupils: Pupils are equal, round, and reactive to light.  Cardiovascular:     Rate and Rhythm: Normal rate and regular rhythm.     Pulses: Normal pulses.     Heart sounds: Normal heart sounds.  Pulmonary:     Effort: Pulmonary effort is normal.     Breath sounds: Normal breath sounds.  Abdominal:     General: Abdomen is flat. Bowel sounds are normal.     Palpations: Abdomen is soft.  Genitourinary:    Penis: Normal.      Testes: Normal.  Musculoskeletal:     Cervical back: Neck supple.  Skin:    General: Skin is warm and dry.  Neurological:     General: No focal deficit present.     Mental Status: He is alert and oriented to person, place, and time.  Psychiatric:        Mood and Affect: Mood normal.        Behavior: Behavior normal.        Thought Content: Thought content normal.        Judgment: Judgment normal.       Last depression screening scores    04/15/2022    8:46 AM 05/13/2019   10:10 AM  PHQ 2/9 Scores  PHQ - 2 Score 0 0  PHQ- 9 Score 0 2   Last fall risk screening    04/15/2022    8:46 AM  Fall Risk   Falls in the past year? 0  Number falls in past yr: 0  Injury with Fall? 0  Risk for fall due to : No Fall Risks  Follow up Falls evaluation completed   Last Audit-C alcohol use screening     04/15/2022    8:45 AM  Alcohol Use Disorder Test (AUDIT)  1. How often do you have a drink containing alcohol? 3  2. How many drinks containing alcohol do you have on a typical day when you are drinking? 1  3. How often do you have six or more drinks on one occasion? 1  AUDIT-C Score 5  4. How often during the last year have you found that you were not able to stop drinking once you had started? 0  5. How often during the last year have you failed to do what was normally expected from you because of drinking? 0  6. How often during the last year have you needed a first drink in the morning to get yourself going after a heavy drinking session? 0  7. How often during the last year have you had a feeling of guilt of remorse after drinking? 0  8. How often during the last year have you been unable to remember what happened the night before because you had been drinking? 0  9. Have you or someone else been injured as a result of your drinking? 0  10. Has a relative or friend or a doctor or another health worker been concerned about your drinking or suggested you cut down? 0  Alcohol Use Disorder Identification Test Final Score (AUDIT) 5   A score of 3 or more in women, and 4 or more in men indicates increased risk for alcohol abuse, EXCEPT if all of the points are from question 1   No results found for any visits on 04/15/22.  Assessment & Plan    Routine Health Maintenance and Physical Exam  Exercise Activities and Dietary recommendations  Goals   None     Immunization History  Administered Date(s) Administered   Hepatitis B 07/14/1996, 08/18/1996, 01/10/1997   Influenza,inj,Quad PF,6+ Mos 07/12/2021   Influenza-Unspecified 07/07/2018   Janssen (J&J) SARS-COV-2 Vaccination 07/28/2020   Tdap 02/08/2020    Health Maintenance  Topic Date Due   Hepatitis C Screening  Never done   INFLUENZA VACCINE  05/07/2022   TETANUS/TDAP  02/07/2030   HIV Screening  Completed   HPV VACCINES   Aged Out   COVID-19 Vaccine  Discontinued    Discussed health benefits of physical activity, and encouraged him to engage in regular exercise appropriate for his age and condition.  1. Annual physical exam Patient advised to quit dipping tobacco - CBC with Differential/Platelet - Comprehensive metabolic panel - Lipid Panel With LDL/HDL Ratio  2. Encounter for hepatitis C screening test for low risk patient  - Hepatitis C Antibody  3. Encounter for screening for HIV  - HIV antibody (with reflex)  4. Acute pain of right shoulder This is improved.  Talked about avoiding power lifting with his weight lifting. - CBC with Differential/Platelet - Comprehensive metabolic panel - Lipid Panel With LDL/HDL Ratio  5. Hypertension, unspecified type Follow-up 6 months to follow-up on blood pressure - CBC with Differential/Platelet - Comprehensive metabolic panel - Lipid Panel With LDL/HDL Ratio  6. Mixed hyperlipidemia  - CBC with Differential/Platelet - Comprehensive metabolic panel - Lipid Panel With LDL/HDL Ratio  7. Low testosterone Followed by urology. - CBC with Differential/Platelet - Comprehensive metabolic panel - Lipid Panel With LDL/HDL Ratio   Return in about 6 months (around 10/16/2022).     I, Wilhemena Durie, MD, have reviewed all  documentation for this visit. The documentation on 04/15/22 for the exam, diagnosis, procedures, and orders are all accurate and complete.    Jameah Rouser Cranford Mon, MD  [facility] [phone] (phone) [phone] (fax)  [place]

## 2022-04-16 LAB — HEMOGLOBIN AND HEMATOCRIT, BLOOD
Hematocrit: 48.1 % (ref 37.5–51.0)
Hemoglobin: 16.3 g/dL (ref 13.0–17.7)

## 2022-04-16 LAB — TESTOSTERONE: Testosterone: 378 ng/dL (ref 264–916)

## 2022-04-16 MED ORDER — BD DISP NEEDLES 18G X 1-1/2" MISC
1.0000 mg | 0 refills | Status: DC
Start: 2022-04-16 — End: 2022-11-25

## 2022-04-16 MED ORDER — "BD DISP NEEDLES 21G X 1-1/2"" MISC": 1.0000 mg | 0 refills | Status: DC

## 2022-04-18 LAB — LIPID PANEL WITH LDL/HDL RATIO
Cholesterol, Total: 245 mg/dL — ABNORMAL HIGH (ref 100–199)
HDL: 42 mg/dL (ref >39)
LDL Chol Calc (NIH): 160 mg/dL — ABNORMAL HIGH (ref 0–99)
LDL/HDL Ratio: 3.8 ratio — ABNORMAL HIGH (ref 0.0–3.6)
Triglycerides: 234 mg/dL — ABNORMAL HIGH (ref 0–149)
VLDL Cholesterol Cal: 43 mg/dL — ABNORMAL HIGH (ref 5–40)

## 2022-04-18 LAB — CBC WITH DIFFERENTIAL/PLATELET
Basophils Absolute: 0 10*3/uL (ref 0.0–0.2)
Basos: 0 %
EOS (ABSOLUTE): 0.1 10*3/uL (ref 0.0–0.4)
Eos: 1 %
Hematocrit: 48.2 % (ref 37.5–51.0)
Hemoglobin: 16.3 g/dL (ref 13.0–17.7)
Immature Grans (Abs): 0 10*3/uL (ref 0.0–0.1)
Immature Granulocytes: 0 %
Lymphocytes Absolute: 1.7 10*3/uL (ref 0.7–3.1)
Lymphs: 30 %
MCH: 29 pg (ref 26.6–33.0)
MCHC: 33.8 g/dL (ref 31.5–35.7)
MCV: 86 fL (ref 79–97)
Monocytes Absolute: 0.4 10*3/uL (ref 0.1–0.9)
Monocytes: 7 %
Neutrophils Absolute: 3.4 10*3/uL (ref 1.4–7.0)
Neutrophils: 62 %
Platelets: 223 10*3/uL (ref 150–450)
RBC: 5.62 x10E6/uL (ref 4.14–5.80)
RDW: 13.6 % (ref 11.6–15.4)
WBC: 5.6 10*3/uL (ref 3.4–10.8)

## 2022-04-18 LAB — COMPREHENSIVE METABOLIC PANEL
ALT: 24 IU/L (ref 0–44)
AST: 20 IU/L (ref 0–40)
Albumin/Globulin Ratio: 2.5 — ABNORMAL HIGH (ref 1.2–2.2)
Albumin: 4.7 g/dL (ref 4.1–5.1)
Alkaline Phosphatase: 57 IU/L (ref 44–121)
BUN/Creatinine Ratio: 14 (ref 9–20)
BUN: 15 mg/dL (ref 6–20)
Bilirubin Total: 0.9 mg/dL (ref 0.0–1.2)
CO2: 20 mmol/L (ref 20–29)
Calcium: 9.5 mg/dL (ref 8.7–10.2)
Chloride: 105 mmol/L (ref 96–106)
Creatinine, Ser: 1.06 mg/dL (ref 0.76–1.27)
Globulin, Total: 1.9 g/dL (ref 1.5–4.5)
Glucose: 92 mg/dL (ref 70–99)
Potassium: 4.6 mmol/L (ref 3.5–5.2)
Sodium: 141 mmol/L (ref 134–144)
Total Protein: 6.6 g/dL (ref 6.0–8.5)
eGFR: 93 mL/min/{1.73_m2} (ref >59)

## 2022-04-18 LAB — HIV ANTIBODY (ROUTINE TESTING W REFLEX): HIV Screen 4th Generation wRfx: NONREACTIVE

## 2022-04-18 LAB — HEPATITIS C ANTIBODY: Hep C Virus Ab: NONREACTIVE

## 2022-05-14 ENCOUNTER — Encounter: Payer: 59 | Admitting: Family Medicine

## 2022-06-08 ENCOUNTER — Other Ambulatory Visit: Payer: Self-pay | Admitting: Physician Assistant

## 2022-06-09 ENCOUNTER — Telehealth: Payer: Self-pay | Admitting: Urology

## 2022-06-09 NOTE — Telephone Encounter (Signed)
Would you call John Murray and get him scheduled for a yearly office visit for IPSS, SH IM with a serum testosterone, PSA, hemoglobin hematocrit prior.

## 2022-06-11 ENCOUNTER — Other Ambulatory Visit: Payer: Self-pay

## 2022-06-11 DIAGNOSIS — E291 Testicular hypofunction: Secondary | ICD-10-CM

## 2022-06-11 NOTE — Telephone Encounter (Signed)
Pt scheduled for labs and office visit, pt confirmed.

## 2022-06-13 ENCOUNTER — Other Ambulatory Visit: Payer: Self-pay | Admitting: Urology

## 2022-06-13 ENCOUNTER — Other Ambulatory Visit: Payer: 59

## 2022-06-13 DIAGNOSIS — E291 Testicular hypofunction: Secondary | ICD-10-CM

## 2022-06-13 NOTE — Progress Notes (Signed)
06/14/22 10:56 AM   John Murray 06-26-1986 [id number]  Referring provider:  Jerrol Banana., MD [address] [place],  [place] [address]  Urological History: 1. Testosterone deficiency  -contributing factors of high BMI -testosterone level (04/2022) 299 -Hemoglobin/hematocrit (04/2022) 15.4/44.2 -testosterone cypionate '200mg'$ /mL, 0.5 cc every 7 days   2. Erectile dysfunction:  -Contributing factors of testosterone deficiency, hypertension, alcohol consumption and hyperlipidemia - SHIM 16  Hypogonadism and Erectile Dysfunction   HPI: John Murray is a 36 y.o.male who presents today for yearly follow up.    PSA (06/2022) 0.7   He is having some issues getting his syringes refilled at the pharmacy.  It may be due to the original prescription is still in the chart stating he injects every 14 days when he is injecting every 7 days.  He states he is feeling a bit more fatigue and noticing less spontaneous erections.  He has been donating blood on a routine basis at this time.  He is having some nocturia, but it is not bothersome.  He attributes it to taking more vitamins.    Patient denies any modifying or aggravating factors.  Patient denies any gross hematuria, dysuria or suprapubic/flank pain.  Patient denies any fevers, chills, nausea or vomiting.     IPSS     Row Name 06/14/22 0800         International Prostate Symptom Score   How often have you had the sensation of not emptying your bladder? Not at All     How often have you had to urinate less than every two hours? Less than 1 in 5 times     How often have you found you stopped and started again several times when you urinated? Not at All     How often have you found it difficult to postpone urination? Not at All     How often have you had a weak urinary stream? Not at All     How often have you had to strain to start urination? Not at All     How many times did you typically get up at night to  urinate? 1 Time     Total IPSS Score 2       Quality of Life due to urinary symptoms   If you were to spend the rest of your life with your urinary condition just the way it is now how would you feel about that? Delighted              Score:  1-7 Mild 8-19 Moderate 20-35 Severe   Patient still having spontaneous erections, but he is having them only 50% of the time.  He denies any pain or curvature with erections.     SHIM     Row Name 06/14/22 0819         SHIM: Over the last 6 months:   How do you rate your confidence that you could get and keep an erection? Moderate     When you had erections with sexual stimulation, how often were your erections hard enough for penetration (entering your partner)? Most Times (much more than half the time)     During sexual intercourse, how often were you able to maintain your erection after you had penetrated (entered) your partner? Sometimes (about half the time)     During sexual intercourse, how difficult was it to maintain your erection to completion of intercourse? Slightly Difficult  When you attempted sexual intercourse, how often was it satisfactory for you? Most Times (much more than half the time)       SHIM Total Score   SHIM 18              Score: 1-7 Severe ED 8-11 Moderate ED 12-16 Mild-Moderate ED 17-21 Mild ED 22-25 No ED   PMH: Past Medical History:  Diagnosis Date   Eustachian tube dysfunction    History of cellulitis     Surgical History: Past Surgical History:  Procedure Laterality Date   WISDOM TOOTH EXTRACTION     2008    Home Medications:  Allergies as of 06/14/2022   No Known Allergies      Medication List        Accurate as of June 14, 2022 10:56 AM. If you have any questions, ask your nurse or doctor.          2-3CC SYRINGE 3 ML Misc 1 mg by Does not apply route every 14 (fourteen) days. 0.75 cc every 7 days What changed: additional instructions   BD Disp Needles 18G  X 1-1/2" Misc Generic drug: NEEDLE (DISP) 18 G 1 mg by Does not apply route every 14 (fourteen) days.   BD Disp Needles 21G X 1-1/2" Misc Generic drug: NEEDLE (DISP) 21 G 1 mg by Does not apply route every 14 (fourteen) days.   lisinopril 40 MG tablet Commonly known as: ZESTRIL Take 1 tablet (40 mg total) by mouth daily.   MULTIVITAMIN PO Take by mouth.   testosterone cypionate 200 MG/ML injection Commonly known as: DEPOTESTOSTERONE CYPIONATE INJECT 0.75 ML EVERY 7 DAYS INTO MUSCLE What changed: additional instructions        Allergies: No Known Allergies  Family History: Family History  Problem Relation Age of Onset   Prostate cancer Father    Healthy Son     Social History:  reports that he has never smoked. His smokeless tobacco use includes chew. He reports current alcohol use. He reports that he does not use drugs.   Physical Exam: BP 135/82   Pulse (!) 53   Ht '5\' 9"'$  (1.753 m)   Wt 212 lb (96.2 kg)   BMI 31.31 kg/m   Constitutional:  Well nourished. Alert and oriented, No acute distress. HEENT: [place] AT, moist mucus membranes.  Trachea midline Cardiovascular: No clubbing, cyanosis, or edema. Respiratory: Normal respiratory effort, no increased work of breathing. Neurologic: Grossly intact, no focal deficits, moving all 4 extremities. Psychiatric: Normal mood and affect.   Laboratory Data: Component Ref Range & Units 1 mo ago  HIV Screen 4th Generation wRfx Non Reactive Non Reactive   Comment: HIV Negative  HIV-1/HIV-2 antibodies and HIV-1 p24 antigen were NOT detected.  There is no laboratory evidence of HIV infection.   Resulting Agency  [facility]         Narrative Performed by: Maryan Puls Performed at:  [address]  [address] [place], [place]  [id number]  Lab Director: Rush Farmer MD, Phone:  [phone]    Specimen Collected: 04/17/22 08:12 Last Resulted: 04/18/22 05:36       Hep C Virus Ab Non Reactive Non Reactive   Comment:  HCV antibody alone does not differentiate between previously  resolved infection and active infection. Equivocal and Reactive  HCV antibody results should be followed up with an HCV RNA test  to support the diagnosis of active HCV infection.   Resulting Agency  [facility]  Narrative Performed by: Maryan Puls Performed at:  [facility]  [address] [place], [place]  [id number]  Lab Director: Rush Farmer MD, Phone:  [phone]    Specimen Collected: 04/17/22 08:12 Last Resulted: 04/18/22 05:36       Component Ref Range & Units 1 mo ago 12 mo ago 2 yr ago  Cholesterol, Total 100 - 199 mg/dL 245 High   232 High   206 High    Triglycerides 0 - 149 mg/dL 234 High   222 High   281 High    HDL >39 mg/dL 42  41  33 Low    VLDL Cholesterol Cal 5 - 40 mg/dL 43 High   41 High   50 High    LDL Chol Calc (NIH) 0 - 99 mg/dL 160 High   150 High   123 High    LDL/HDL Ratio 0.0 - 3.6 ratio 3.8 High      Comment:                                     LDL/HDL Ratio                                              Men  Women                                1/2 Avg.Risk  1.0    1.5                                    Avg.Risk  3.6    3.2                                 2X Avg.Risk  6.2    5.0                                 3X Avg.Risk  8.0    6.1   Resulting Agency  [facility]         Narrative Performed by: Maryan Puls Performed at:  [facility]  [address], [place], [place]  [id number]  Lab Director: Rush Farmer MD, Phone:  [phone]    Specimen Collected: 04/17/22 08:12 Last Resulted: 04/18/22 05:36          Latest Ref Rng & Units 04/17/2022    8:12 AM 06/15/2021    8:34 AM  CMP  Glucose 70 - 99 mg/dL 92  97   BUN 6 - 20 mg/dL 15  18   Creatinine 0.76 - 1.27 mg/dL 1.06  1.12   Sodium 134 - 144 mmol/L 141  140   Potassium 3.5 - 5.2 mmol/L 4.6  4.5   Chloride 96 - 106 mmol/L 105  100   CO2 20 - 29 mmol/L 20  21   Calcium 8.7 - 10.2 mg/dL 9.5   9.7   Total Protein 6.0 - 8.5 g/dL 6.6  7.0   Total Bilirubin 0.0 - 1.2 mg/dL 0.9  1.5   Alkaline Phos  44 - 121 IU/L 57  57   AST 0 - 40 IU/L 20  24   ALT 0 - 44 IU/L 24  31        Latest Ref Rng & Units 06/13/2022    2:28 PM 04/17/2022    8:12 AM 04/15/2022    8:38 AM  CBC  WBC 3.4 - 10.8 x10E3/uL  5.6    Hemoglobin 13.0 - 17.7 g/dL 15.4  16.3  16.3   Hematocrit 37.5 - 51.0 % 44.2  48.2  48.1   Platelets 150 - 450 x10E3/uL  223       Testosterone  Latest Ref Rng 264 - 916 ng/dL  01/20/2020 172 (L)   01/28/2020 303   04/11/2020 637   04/26/2020 795   10/25/2020 461   12/22/2020 158 (L)   02/26/2021 967 (H)   05/31/2021 867   10/16/2021 118 (L)   01/17/2022 653   04/15/2022 378     Legend: (L) Low (H) High I have reviewed the labs.     Assessment & Plan:    1. Testosterone deficiency  -testosterone levels are subtherapeutic -H & H WNL -Increase testosterone cypionate 200 mg/milliliters, 0.75 cc every 7 days  2. Nocturia -not bothersome at this time -continue to monitor  3. Erectile dysfunction:    -Testosterone level is subtherapeutic at this time -We will reassess when his testosterone levels are therapeutic     Return in about 1 month (around 07/14/2022) for Testosterone (midway between injections) [facility] [place]   [facility] [address] [place], [place] [address] [phone]

## 2022-06-14 ENCOUNTER — Ambulatory Visit: Payer: 59 | Admitting: Urology

## 2022-06-14 ENCOUNTER — Encounter: Payer: Self-pay | Admitting: Urology

## 2022-06-14 VITALS — BP 135/82 | HR 53 | Ht 69.0 in | Wt 212.0 lb

## 2022-06-14 DIAGNOSIS — N529 Male erectile dysfunction, unspecified: Secondary | ICD-10-CM | POA: Diagnosis not present

## 2022-06-14 DIAGNOSIS — R351 Nocturia: Secondary | ICD-10-CM

## 2022-06-14 DIAGNOSIS — E291 Testicular hypofunction: Secondary | ICD-10-CM | POA: Diagnosis not present

## 2022-06-14 LAB — HEMOGLOBIN AND HEMATOCRIT, BLOOD
Hematocrit: 44.2 % (ref 37.5–51.0)
Hemoglobin: 15.4 g/dL (ref 13.0–17.7)

## 2022-06-14 LAB — TESTOSTERONE: Testosterone: 299 ng/dL (ref 264–916)

## 2022-06-14 LAB — PSA: Prostate Specific Ag, Serum: 0.7 ng/mL (ref 0.0–4.0)

## 2022-06-14 MED ORDER — TESTOSTERONE CYPIONATE 200 MG/ML IM SOLN
INTRAMUSCULAR | 0 refills | Status: DC
Start: 2022-06-14 — End: 2022-09-04

## 2022-06-14 MED ORDER — SYRINGE 2-3 ML 3 ML MISC
1.0000 mg | 3 refills | Status: DC
Start: 2022-06-14 — End: 2023-06-24

## 2022-06-28 ENCOUNTER — Other Ambulatory Visit: Payer: Self-pay | Admitting: *Deleted

## 2022-06-28 DIAGNOSIS — E291 Testicular hypofunction: Secondary | ICD-10-CM

## 2022-06-28 DIAGNOSIS — N529 Male erectile dysfunction, unspecified: Secondary | ICD-10-CM

## 2022-07-01 ENCOUNTER — Other Ambulatory Visit: Payer: 59

## 2022-07-01 DIAGNOSIS — N529 Male erectile dysfunction, unspecified: Secondary | ICD-10-CM

## 2022-07-01 DIAGNOSIS — E291 Testicular hypofunction: Secondary | ICD-10-CM

## 2022-07-03 LAB — TESTOSTERONE: Testosterone: 880 ng/dL (ref 264–916)

## 2022-07-03 LAB — HEMOGLOBIN AND HEMATOCRIT, BLOOD
Hematocrit: 45.2 % (ref 37.5–51.0)
Hemoglobin: 15.2 g/dL (ref 13.0–17.7)

## 2022-08-15 ENCOUNTER — Other Ambulatory Visit: Payer: Self-pay | Admitting: Family Medicine

## 2022-08-15 DIAGNOSIS — I10 Essential (primary) hypertension: Secondary | ICD-10-CM

## 2022-08-15 MED ORDER — LISINOPRIL 40 MG PO TABS: 40.0000 mg | ORAL_TABLET | Freq: Every day | ORAL | 0 refills | Status: DC

## 2022-08-15 NOTE — Telephone Encounter (Signed)
Patient has future OV scheduled, will refill medication.  Requested Prescriptions  Pending Prescriptions Disp Refills   lisinopril (ZESTRIL) 40 MG tablet 90 tablet 0    Sig: Take 1 tablet (40 mg total) by mouth daily.     There is no refill protocol information for this order

## 2022-08-15 NOTE — Telephone Encounter (Signed)
Medication Refill - Medication: lisinopril (ZESTRIL) 40 MG tablet   Has the patient contacted their pharmacy? Yes.   But it under Dr Rosanna Randy name.  Preferred Pharmacy (with phone number or street name[id number] [place] for an appointment in the last year OR does the patient have an upcoming appointment? Yes.    Pt had a scheduled appt w/ Dr GRosanna Randyon Oct 15, 2022.  He just kept that appt, and will see Dr RQuentin Cornwallon 10/15/2022.  But going out of town tomorrow and will be out of this med today.

## 2022-09-04 ENCOUNTER — Other Ambulatory Visit: Payer: Self-pay | Admitting: Urology

## 2022-09-04 DIAGNOSIS — E291 Testicular hypofunction: Secondary | ICD-10-CM

## 2022-10-01 ENCOUNTER — Other Ambulatory Visit: Payer: Self-pay

## 2022-10-01 DIAGNOSIS — E291 Testicular hypofunction: Secondary | ICD-10-CM

## 2022-10-02 ENCOUNTER — Other Ambulatory Visit: Payer: 59

## 2022-10-02 DIAGNOSIS — E291 Testicular hypofunction: Secondary | ICD-10-CM

## 2022-10-03 LAB — HEMOGLOBIN AND HEMATOCRIT, BLOOD
Hematocrit: 46.8 % (ref 37.5–51.0)
Hemoglobin: 15.2 g/dL (ref 13.0–17.7)

## 2022-10-03 LAB — TESTOSTERONE: Testosterone: 658 ng/dL (ref 264–916)

## 2022-10-04 ENCOUNTER — Telehealth: Payer: Self-pay | Admitting: *Deleted

## 2022-10-04 NOTE — Telephone Encounter (Signed)
Notified patient as instructed, patient pleased. Discussed follow-up appointments, patient agrees  

## 2022-10-04 NOTE — Telephone Encounter (Signed)
-----   Message from Debroah Loop, [place] sent at 10/03/2022  4:15 PM EST ----- Labs look great, continue testosterone dose as-is. Please schedule him for 3 month lab visit for testosterone, H&H and an annual follow-up with Larene Beach next September.

## 2022-10-14 NOTE — Progress Notes (Unsigned)
Established patient visit   Patient: John Murray   DOB: 1986/09/13   37 y.o. Male  MRN: [id number] Visit Date: 10/15/2022  Today's healthcare provider: Eulis Foster, MD   No chief complaint on file.  Subjective    HPI   Lipid/Cholesterol, Follow-up  Last lipid panel Other pertinent labs  Lab Results  Component Value Date   CHOL 245 (H) 04/17/2022   HDL 42 04/17/2022   LDLCALC 160 (H) 04/17/2022   TRIG 234 (H) 04/17/2022   CHOLHDL 5.7 (H) 06/15/2021   Lab Results  Component Value Date   ALT 24 04/17/2022   AST 20 04/17/2022   PLT 223 04/17/2022   TSH 0.527 06/15/2021     He was last seen for this {1-12:18279} {days/wks/mos/yrs:310907} ago.  Management since that visit includes ***.  He reports {excellent/good/fair/poor:19665} compliance with treatment. He {is/is not:9024} having side effects. {document side effects if present:1}  Symptoms: {Yes/No:20286} chest pain {Yes/No:20286} chest pressure/discomfort  {Yes/No:20286} dyspnea {Yes/No:20286} lower extremity edema  {Yes/No:20286} numbness or tingling of extremity {Yes/No:20286} orthopnea  {Yes/No:20286} palpitations {Yes/No:20286} paroxysmal nocturnal dyspnea  {Yes/No:20286} speech difficulty {Yes/No:20286} syncope   Current diet: {diet habits:16563} Current exercise: {exercise types:16438}  The ASCVD Risk score (Arnett DK, et al., 2019) failed to calculate for the following reasons:   The 2019 ASCVD risk score is only valid for ages 77 to 77  ---------------------------------------------------------------------------------------------------   Medications: Outpatient Medications Prior to Visit  Medication Sig   lisinopril (ZESTRIL) 40 MG tablet Take 1 tablet (40 mg total) by mouth daily.   Multiple Vitamin (MULTIVITAMIN PO) Take by mouth.   NEEDLE, DISP, 18 G (BD DISP NEEDLES) 18G X 1-1/2" MISC 1 mg by Does not apply route every 14 (fourteen) days.   NEEDLE, DISP, 21 G (BD DISP NEEDLES)  21G X 1-1/2" MISC 1 mg by Does not apply route every 14 (fourteen) days.   Syringe, Disposable, (2-3CC SYRINGE) 3 ML MISC 1 mg by Does not apply route every 14 (fourteen) days. 0.75 cc every 7 days   testosterone cypionate (DEPOTESTOSTERONE CYPIONATE) 200 MG/ML injection INJECT 0.75 ML EVERY 7 DAYS INTO MUSCLE   No facility-administered medications prior to visit.    Review of Systems  {Labs  Heme  Chem  Endocrine  Serology  Results Review (optional):23779}   Objective    There were no vitals taken for this visit. {Show previous vital signs (optional):23777}  Physical Exam Vitals reviewed.  Constitutional:      General: He is not in acute distress.    Appearance: Normal appearance. He is not ill-appearing, toxic-appearing or diaphoretic.  Eyes:     Conjunctiva/sclera: Conjunctivae normal.  Cardiovascular:     Rate and Rhythm: Normal rate and regular rhythm.     Pulses: Normal pulses.     Heart sounds: Normal heart sounds. No murmur heard.    No friction rub. No gallop.  Pulmonary:     Effort: Pulmonary effort is normal. No respiratory distress.     Breath sounds: Normal breath sounds. No stridor. No wheezing, rhonchi or rales.  Abdominal:     General: Bowel sounds are normal. There is no distension.     Palpations: Abdomen is soft.     Tenderness: There is no abdominal tenderness.  Musculoskeletal:     Right lower leg: No edema.     Left lower leg: No edema.  Skin:    Findings: No erythema or rash.  Neurological:     Mental Status: He  is alert and oriented to person, place, and time.       No results found for any visits on 10/15/22.  Assessment & Plan     Problem List Items Addressed This Visit   None    No follow-ups on file.      I, Eulis Foster, MD, have reviewed all documentation for this visit.  Portions of this information were initially documented by the CMA and reviewed by me for thoroughness and accuracy.      Eulis Foster, MD  [facility] [phone] (phone) [phone] (fax)  [place]

## 2022-10-15 ENCOUNTER — Ambulatory Visit: Payer: 59 | Admitting: Family Medicine

## 2022-10-15 ENCOUNTER — Encounter: Payer: Self-pay | Admitting: Family Medicine

## 2022-10-15 VITALS — BP 128/78 | HR 73 | Temp 98.2°F | Resp 16 | Wt 232.9 lb

## 2022-10-15 DIAGNOSIS — H01136 Eczematous dermatitis of left eye, unspecified eyelid: Secondary | ICD-10-CM | POA: Diagnosis not present

## 2022-10-15 DIAGNOSIS — Z720 Tobacco use: Secondary | ICD-10-CM

## 2022-10-15 DIAGNOSIS — I10 Essential (primary) hypertension: Secondary | ICD-10-CM | POA: Insufficient documentation

## 2022-10-15 DIAGNOSIS — E7800 Pure hypercholesterolemia, unspecified: Secondary | ICD-10-CM

## 2022-10-15 MED ORDER — HYDROCORTISONE 1 % EX OINT: 1.0000 | TOPICAL_OINTMENT | Freq: Two times a day (BID) | CUTANEOUS | 0 refills | Status: AC

## 2022-10-15 NOTE — Patient Instructions (Addendum)
The area around your eyelid appears to be eye lid dermatitis   Please apply the hydrocortisone ointment twice daily for only 7-10 days   Please continue your lisinopril '40mg'$  daily and measure your blood pressure 1-2 hours after taking your medication with goal of less than 140/90

## 2022-10-15 NOTE — Assessment & Plan Note (Signed)
Prescribed topical hydrocortisone 1% daily for 7-10 days  Follow up if not improved or if tenderness, pruritus starts

## 2022-10-15 NOTE — Assessment & Plan Note (Signed)
BP improved on recheck, goal <140/90  Stable  Will continue lisinopril '40mg'$  daily  Recommended checking BP 1-2 hours after medication Will follow up during physical in 6 months

## 2022-11-18 ENCOUNTER — Other Ambulatory Visit: Payer: Self-pay | Admitting: Family Medicine

## 2022-11-18 DIAGNOSIS — I10 Essential (primary) hypertension: Secondary | ICD-10-CM

## 2022-11-25 ENCOUNTER — Other Ambulatory Visit: Payer: Self-pay | Admitting: Physician Assistant

## 2022-11-26 ENCOUNTER — Other Ambulatory Visit: Payer: Self-pay | Admitting: Physician Assistant

## 2022-11-29 ENCOUNTER — Other Ambulatory Visit: Payer: Self-pay | Admitting: Urology

## 2022-11-29 DIAGNOSIS — E291 Testicular hypofunction: Secondary | ICD-10-CM

## 2022-12-30 ENCOUNTER — Other Ambulatory Visit: Payer: Self-pay

## 2022-12-30 DIAGNOSIS — E291 Testicular hypofunction: Secondary | ICD-10-CM

## 2022-12-31 ENCOUNTER — Other Ambulatory Visit: Payer: 59

## 2022-12-31 DIAGNOSIS — E291 Testicular hypofunction: Secondary | ICD-10-CM

## 2023-01-01 LAB — HEMOGLOBIN AND HEMATOCRIT, BLOOD
Hematocrit: 46.3 % (ref 37.5–51.0)
Hemoglobin: 14.5 g/dL (ref 13.0–17.7)

## 2023-01-01 LAB — TESTOSTERONE: Testosterone: 881 ng/dL (ref 264–916)

## 2023-01-02 ENCOUNTER — Other Ambulatory Visit: Payer: 59

## 2023-01-03 ENCOUNTER — Other Ambulatory Visit: Payer: 59

## 2023-01-10 NOTE — Progress Notes (Deleted)
  [name] [address] [place] [address] Phone: [phone] Subjective:    I'm seeing this patient by the request  of:  Simmons-Robinson, Tawanna Cooler, MD  CC:   [id number]  John Murray is a 37 y.o. male coming in with complaint of thoracic spine pain. Patient states       Past Medical History:  Diagnosis Date   Eustachian tube dysfunction    History of cellulitis    Past Surgical History:  Procedure Laterality Date   WISDOM TOOTH EXTRACTION     2008   Social History   Socioeconomic History   Marital status: Married    Spouse name: Not on file   Number of children: Not on file   Years of education: Not on file   Highest education level: Not on file  Occupational History   Not on file  Tobacco Use   Smoking status: Never   Smokeless tobacco: Current    Types: Chew  Substance and Sexual Activity   Alcohol use: Yes    Comment: 1-2 occasionally    Drug use: No   Sexual activity: Not Currently  Other Topics Concern   Not on file  Social History Narrative   Not on file   Social Determinants of Health   Financial Resource Strain: Not on file  Food Insecurity: Not on file  Transportation Needs: Not on file  Physical Activity: Not on file  Stress: Not on file  Social Connections: Not on file   No Known Allergies Family History  Problem Relation Age of Onset   Prostate cancer Father    Healthy Son     Current Outpatient Medications (Endocrine & Metabolic):    testosterone cypionate (DEPOTESTOSTERONE CYPIONATE) 200 MG/ML injection, INJECT 0.75 ML EVERY 7 DAYS INTO MUSCLE  Current Outpatient Medications (Cardiovascular):    lisinopril (ZESTRIL) 40 MG tablet, TAKE 1 TABLET BY MOUTH EVERY DAY     Current Outpatient Medications (Other):    BD DISP NEEDLES 18G X 1-1/2" MISC, 1 MG BY DOES NOT APPLY ROUTE EVERY 14 (FOURTEEN) DAYS.   BD DISP NEEDLES 21G X 1-1/2" MISC, 1 MG BY DOES NOT APPLY ROUTE EVERY 14 (FOURTEEN)  DAYS.   Multiple Vitamin (MULTIVITAMIN PO), Take by mouth.   Syringe, Disposable, (2-3CC SYRINGE) 3 ML MISC, 1 mg by Does not apply route every 14 (fourteen) days. 0.75 cc every 7 days   Reviewed prior external information including notes and imaging from  primary care provider As well as notes that were available from care everywhere and other healthcare systems.  Past medical history, social, surgical and family history all reviewed in electronic medical record.  No pertanent information unless stated regarding to the chief complaint.   Review of Systems:  No headache, visual changes, nausea, vomiting, diarrhea, constipation, dizziness, abdominal pain, skin rash, fevers, chills, night sweats, weight loss, swollen lymph nodes, body aches, joint swelling, chest pain, shortness of breath, mood changes. POSITIVE muscle aches  Objective  There were no vitals taken for this visit.   General: No apparent distress alert and oriented x3 mood and affect normal, dressed appropriately.  HEENT: Pupils equal, extraocular movements intact  Respiratory: Patient's speak in full sentences and does not appear short of breath  Cardiovascular: No lower extremity edema, non tender, no erythema      Impression and Recommendations:

## 2023-01-15 ENCOUNTER — Ambulatory Visit: Payer: 59 | Admitting: Family Medicine

## 2023-01-23 NOTE — Progress Notes (Signed)
[name] [address] [place] [address] Phone: [phone] Subjective:   [name], am serving as a scribe for Dr. Antoine Primas.  I'm seeing this patient by the request  of:  Simmons-Robinson, Makiera, MD  CC: back pain   [id number]  AMRON GUERRETTE is a 37 y.o. male coming in with complaint of thoracic spine pain. Patient states feels like an intense muscle cramp. Was going to chiropractor in December 2x a week. Notice it more when flat benching. Sporadic in nature. Pulling feeling with rotation sometimes. No radiating pain or numbness. Massage gun helps a little and takes ibuprofen to help as well.       Past Medical History:  Diagnosis Date   Eustachian tube dysfunction    History of cellulitis    Past Surgical History:  Procedure Laterality Date   WISDOM TOOTH EXTRACTION     2008   Social History   Socioeconomic History   Marital status: Married    Spouse name: Not on file   Number of children: Not on file   Years of education: Not on file   Highest education level: Not on file  Occupational History   Not on file  Tobacco Use   Smoking status: Never   Smokeless tobacco: Current    Types: Chew  Substance and Sexual Activity   Alcohol use: Yes    Comment: 1-2 occasionally    Drug use: No   Sexual activity: Not Currently  Other Topics Concern   Not on file  Social History Narrative   Not on file   Social Determinants of Health   Financial Resource Strain: Not on file  Food Insecurity: Not on file  Transportation Needs: Not on file  Physical Activity: Not on file  Stress: Not on file  Social Connections: Not on file   No Known Allergies Family History  Problem Relation Age of Onset   Prostate cancer Father    Healthy Son     Current Outpatient Medications (Endocrine & Metabolic):    testosterone cypionate (DEPOTESTOSTERONE CYPIONATE) 200 MG/ML injection, INJECT 0.75 ML EVERY 7 DAYS INTO  MUSCLE  Current Outpatient Medications (Cardiovascular):    lisinopril (ZESTRIL) 40 MG tablet, TAKE 1 TABLET BY MOUTH EVERY DAY   Current Outpatient Medications (Analgesics):    meloxicam (MOBIC) 15 MG tablet, Take 1 tablet (15 mg total) by mouth daily.   Current Outpatient Medications (Other):    tiZANidine (ZANAFLEX) 4 MG tablet, Take 1 tablet (4 mg total) by mouth at bedtime.   BD DISP NEEDLES 18G X 1-1/2" MISC, 1 MG BY DOES NOT APPLY ROUTE EVERY 14 (FOURTEEN) DAYS.   BD DISP NEEDLES 21G X 1-1/2" MISC, 1 MG BY DOES NOT APPLY ROUTE EVERY 14 (FOURTEEN) DAYS.   Multiple Vitamin (MULTIVITAMIN PO), Take by mouth.   Syringe, Disposable, (2-3CC SYRINGE) 3 ML MISC, 1 mg by Does not apply route every 14 (fourteen) days. 0.75 cc every 7 days   Reviewed prior external information including notes and imaging from  primary care provider As well as notes that were available from care everywhere and other healthcare systems.  Past medical history, social, surgical and family history all reviewed in electronic medical record.  No pertanent information unless stated regarding to the chief complaint.   Review of Systems:  No headache, visual changes, nausea, vomiting, diarrhea, constipation, dizziness, abdominal pain, skin rash, fevers, chills, night sweats, weight loss, swollen lymph nodes, body aches, joint swelling, chest  pain, shortness of breath, mood changes. POSITIVE muscle aches  Objective  Blood pressure 122/82, pulse 76, height  (1.753 m), weight 229 lb (103.9 kg), SpO2 98 %.   General: No apparent distress alert and oriented x3 mood and affect normal, dressed appropriately.  HEENT: Pupils equal, extraocular movements intact  Respiratory: Patient's speak in full sentences and does not appear short of breath  Cardiovascular: No lower extremity edema, non tender, no erythema  Back exam does have some tightness noted on the right paraspinal musculature.  Seems to be more of the hip  flexor on the right.  Positive FABER test on the right also noted.  Negative straight leg test.  Osteopathic findings T9 extended rotated and side bent right L1 flexed rotated and side bent right Sacrum right on right  [address]7110; 15 additional minutes spent for Therapeutic exercises as stated in above notes.  This included exercises focusing on stretching, strengthening, with significant focus on eccentric aspects.   Long term goals include an improvement in range of motion, strength, endurance as well as avoiding reinjury. Patient's frequency would include in 1-2 times a day, 3-5 times a week for a duration of 6-12 weeks. Low back exercises that included:  Pelvic tilt/bracing instruction to focus on control of the pelvic girdle and lower abdominal muscles  Glute strengthening exercises, focusing on proper firing of the glutes without engaging the low back muscles Proper stretching techniques for maximum relief for the hamstrings, hip flexors, low back and some rotation where tolerated   Proper technique shown and discussed handout in great detail with ATC.  All questions were discussed and answered.     Impression and Recommendations:     The above documentation has been reviewed and is accurate and complete Judi Saa, DO

## 2023-01-27 ENCOUNTER — Ambulatory Visit (INDEPENDENT_AMBULATORY_CARE_PROVIDER_SITE_OTHER): Payer: 59

## 2023-01-27 ENCOUNTER — Ambulatory Visit: Payer: 59 | Admitting: Family Medicine

## 2023-01-27 ENCOUNTER — Encounter: Payer: Self-pay | Admitting: Family Medicine

## 2023-01-27 VITALS — BP 122/82 | HR 76 | Ht 69.0 in | Wt 229.0 lb

## 2023-01-27 DIAGNOSIS — M9903 Segmental and somatic dysfunction of lumbar region: Secondary | ICD-10-CM | POA: Diagnosis not present

## 2023-01-27 DIAGNOSIS — M9902 Segmental and somatic dysfunction of thoracic region: Secondary | ICD-10-CM | POA: Diagnosis not present

## 2023-01-27 DIAGNOSIS — M546 Pain in thoracic spine: Secondary | ICD-10-CM | POA: Diagnosis not present

## 2023-01-27 DIAGNOSIS — G8929 Other chronic pain: Secondary | ICD-10-CM | POA: Diagnosis not present

## 2023-01-27 DIAGNOSIS — M545 Low back pain, unspecified: Secondary | ICD-10-CM | POA: Diagnosis not present

## 2023-01-27 DIAGNOSIS — M9904 Segmental and somatic dysfunction of sacral region: Secondary | ICD-10-CM

## 2023-01-27 MED ORDER — MELOXICAM 15 MG PO TABS: 15.0000 mg | ORAL_TABLET | Freq: Every day | ORAL | 0 refills | Status: DC

## 2023-01-27 MED ORDER — TIZANIDINE HCL 4 MG PO TABS
4.0000 mg | ORAL_TABLET | Freq: Every day | ORAL | 0 refills | Status: DC
Start: 2023-01-27 — End: 2023-02-24

## 2023-01-27 NOTE — Patient Instructions (Addendum)
Xrays today Meloxicam  take in 3-5 days bursts when need Zanaflex  when needed at night Do prescribed exercises at least 3x a week See you again in 6-8 weeks

## 2023-01-27 NOTE — Assessment & Plan Note (Signed)
Patient has more of a low back pain that seems to be intermittent.  Seems to be muscular in nature.  Will get x-rays to further evaluate for any bony abnormality that could be contributing.  Responded extremely well to osteopathic manipulation.  Meloxicam as well as muscle relaxer given.  Hopefully that this will be helpful as well.  Warned of potential side effects.  Increase activity slowly.  Follow-up with me again in 6 to 8 weeks

## 2023-02-16 ENCOUNTER — Other Ambulatory Visit: Payer: Self-pay | Admitting: Family Medicine

## 2023-02-16 DIAGNOSIS — I10 Essential (primary) hypertension: Secondary | ICD-10-CM

## 2023-02-17 NOTE — Telephone Encounter (Signed)
Requested Prescriptions  Pending Prescriptions Disp Refills   lisinopril (ZESTRIL) 40 MG tablet [Pharmacy Med Name: LISINOPRIL 40 MG TABLET] 90 tablet 0    Sig: TAKE 1 TABLET BY MOUTH EVERY DAY     Cardiovascular:  ACE Inhibitors Failed - 02/17/2023 11:06 AM      Failed - Cr in normal range and within 180 days    Creatinine, Ser  Date Value Ref Range Status  04/17/2022 1.06 0.76 - 1.27 mg/dL Final         Failed - K in normal range and within 180 days    Potassium  Date Value Ref Range Status  04/17/2022 4.6 3.5 - 5.2 mmol/L Final         Passed - Patient is not pregnant      Passed - Last BP in normal range    BP Readings from Last 1 Encounters:  01/27/23 122/82         Passed - Valid encounter within last 6 months    Recent Outpatient Visits           4 months ago Hypertension, unspecified type   [place] [facility] Ronnald Ramp, MD   10 months ago Annual physical exam   [facility] Bosie Clos, MD   1 year ago Acute pain of right shoulder   [place] [facility] Bosie Clos, MD   1 year ago Need for influenza vaccination   [place] [facility] Bosie Clos, MD   1 year ago Hypertension, unspecified type   [facility] Chrismon, Jodell Cipro, PA-C       Future Appointments             In 3 weeks Judi Saa, DO [place] [facility]   In 1 month Simmons-Robinson, [place], MD [facility]   In 4 months McGowan, Elana Alm [facility] [place]

## 2023-02-23 ENCOUNTER — Other Ambulatory Visit: Payer: Self-pay | Admitting: Family Medicine

## 2023-03-07 NOTE — Progress Notes (Deleted)
  [name] [address] [place] [address] Phone: [phone] Subjective:    I'm seeing this patient by the request  of:  Simmons-Robinson, [place], MD  CC:   [id number]  John Murray is a 37 y.o. male coming in with complaint of back and neck pain. OMT 01/27/2023. Patient states   Medications patient has been prescribed: Meloxicam, Zanaflex  Taking:         Reviewed prior external information including notes and imaging from previsou exam, outside providers and external EMR if available.   As well as notes that were available from care everywhere and other healthcare systems.  Past medical history, social, surgical and family history all reviewed in electronic medical record.  No pertanent information unless stated regarding to the chief complaint.   Past Medical History:  Diagnosis Date   Eustachian tube dysfunction    History of cellulitis     No Known Allergies   Review of Systems:  No headache, visual changes, nausea, vomiting, diarrhea, constipation, dizziness, abdominal pain, skin rash, fevers, chills, night sweats, weight loss, swollen lymph nodes, body aches, joint swelling, chest pain, shortness of breath, mood changes. POSITIVE muscle aches  Objective  There were no vitals taken for this visit.   General: No apparent distress alert and oriented x3 mood and affect normal, dressed appropriately.  HEENT: Pupils equal, extraocular movements intact  Respiratory: Patient's speak in full sentences and does not appear short of breath  Cardiovascular: No lower extremity edema, non tender, no erythema  Gait MSK:  Back   Osteopathic findings  C2 flexed rotated and side bent right C6 flexed rotated and side bent left T3 extended rotated and side bent right inhaled rib T9 extended rotated and side bent left L2 flexed rotated and side bent right Sacrum right on right       Assessment and Plan:  No  problem-specific Assessment & Plan notes found for this encounter.    Nonallopathic problems  Decision today to treat with OMT was based on Physical Exam  After verbal consent patient was treated with HVLA, ME, FPR techniques in cervical, rib, thoracic, lumbar, and sacral  areas  Patient tolerated the procedure well with improvement in symptoms  Patient given exercises, stretches and lifestyle modifications  See medications in patient instructions if given  Patient will follow up in 4-8 weeks     The above documentation has been reviewed and is accurate and complete John Saa, DO         Note: This dictation was prepared with Dragon dictation along with smaller phrase technology. Any transcriptional errors that result from this process are unintentional.

## 2023-03-10 ENCOUNTER — Ambulatory Visit: Payer: 59 | Admitting: Family Medicine

## 2023-03-10 NOTE — Progress Notes (Unsigned)
  [name] [address] [place] [address] Phone: [phone] Subjective:   Bruce Donath, am serving as a scribe for Dr. Antoine Primas.  I'm seeing this patient by the request  of:  Simmons-Robinson, Makiera, MD  CC: Back and neck pain follow-up  [id number]  TILMAN MEINEKE is a 37 y.o. male coming in with complaint of back and neck pain. OMT 01/27/2023. Patient states that he is doing fine today. Has some tightness.   Medications patient has been prescribed: Meloxicam, zanaflex  Taking:         Reviewed prior external information including notes and imaging from previsou exam, outside providers and external EMR if available.   As well as notes that were available from care everywhere and other healthcare systems.  Past medical history, social, surgical and family history all reviewed in electronic medical record.  No pertanent information unless stated regarding to the chief complaint.   Past Medical History:  Diagnosis Date   Eustachian tube dysfunction    History of cellulitis     No Known Allergies   Review of Systems:  No headache, visual changes, nausea, vomiting, diarrhea, constipation, dizziness, abdominal pain, skin rash, fevers, chills, night sweats, weight loss, swollen lymph nodes, body aches, joint swelling, chest pain, shortness of breath, mood changes. POSITIVE muscle aches  Objective  Blood pressure 124/84, pulse 61, height 5\' 9"  (1.753 m), weight 219 lb (99.3 kg), SpO2 98 %.   General: No apparent distress alert and oriented x3 mood and affect normal, dressed appropriately.  HEENT: Pupils equal, extraocular movements intact  Respiratory: Patient's speak in full sentences and does not appear short of breath  Cardiovascular: No lower extremity edema, non tender, no erythema  Back exam does have some loss of lordosis noted.  Some tenderness to palpation in the paraspinal musculature.  Mild tightness with FABER  test bilaterally.  Osteopathic findings  C2 flexed rotated and side bent left C6 flexed rotated and side bent left T9 extended rotated and side bent left L2 flexed rotated and side bent right Sacrum right on right       Assessment and Plan:  Low back pain Tightness in the lower back still noted but nothing severe.  Does respond well to the intervention is one of the modalities.  Discussed core strengthening exercises that I think will be helpful.  Increase activity slowly.  Follow-up again in 6 to 8 weeks    Nonallopathic problems  Decision today to treat with OMT was based on Physical Exam  After verbal consent patient was treated with HVLA, ME, FPR techniques in cervical, , thoracic, lumbar, and sacral  areas  Patient tolerated the procedure well with improvement in symptoms  Patient given exercises, stretches and lifestyle modifications  See medications in patient instructions if given  Patient will follow up in 4-8 weeks     The above documentation has been reviewed and is accurate and complete Judi Saa, DO         Note: This dictation was prepared with Dragon dictation along with smaller phrase technology. Any transcriptional errors that result from this process are unintentional.

## 2023-03-12 ENCOUNTER — Ambulatory Visit: Payer: 59 | Admitting: Family Medicine

## 2023-03-12 VITALS — BP 124/84 | HR 61 | Ht 69.0 in | Wt 219.0 lb

## 2023-03-12 DIAGNOSIS — M9903 Segmental and somatic dysfunction of lumbar region: Secondary | ICD-10-CM

## 2023-03-12 DIAGNOSIS — M9904 Segmental and somatic dysfunction of sacral region: Secondary | ICD-10-CM | POA: Diagnosis not present

## 2023-03-12 DIAGNOSIS — M545 Low back pain, unspecified: Secondary | ICD-10-CM

## 2023-03-12 DIAGNOSIS — M9901 Segmental and somatic dysfunction of cervical region: Secondary | ICD-10-CM | POA: Diagnosis not present

## 2023-03-12 DIAGNOSIS — M9902 Segmental and somatic dysfunction of thoracic region: Secondary | ICD-10-CM

## 2023-03-12 DIAGNOSIS — G8929 Other chronic pain: Secondary | ICD-10-CM

## 2023-03-12 DIAGNOSIS — M9908 Segmental and somatic dysfunction of rib cage: Secondary | ICD-10-CM | POA: Diagnosis not present

## 2023-03-12 NOTE — Assessment & Plan Note (Signed)
Tightness in the lower back still noted but nothing severe.  Does respond well to the intervention is one of the modalities.  Discussed core strengthening exercises that I think will be helpful.  Increase activity slowly.  Follow-up again in 6 to 8 weeks

## 2023-03-12 NOTE — Patient Instructions (Signed)
Overall doing well Have fun on all of your travels See me again in 2-3 months

## 2023-04-01 ENCOUNTER — Other Ambulatory Visit: Payer: Self-pay | Admitting: Family Medicine

## 2023-04-01 DIAGNOSIS — E291 Testicular hypofunction: Secondary | ICD-10-CM

## 2023-04-04 MED ORDER — TESTOSTERONE CYPIONATE 200 MG/ML IM SOLN
INTRAMUSCULAR | 2 refills | Status: DC
Start: 2023-04-04 — End: 2023-06-24

## 2023-04-15 NOTE — Progress Notes (Unsigned)
Complete physical exam   Patient: John Murray   DOB: 1985-11-04   36 y.o. Male  MRN: [id number] Visit Date: 04/16/2023  Today's healthcare provider: Ronnald Ramp, MD   No chief complaint on file.  Subjective    John Murray is a 37 y.o. male who presents today for a complete physical exam.   He reports consuming a {diet types:17450} diet.   {Exercise:19826} He generally feels {well/fairly well/poorly:18703}.   He reports sleeping {well/fairly well/poorly:18703}.    He {does/does not:200015} have additional problems to discuss today.     Past Medical History:  Diagnosis Date   Eustachian tube dysfunction    History of cellulitis    Past Surgical History:  Procedure Laterality Date   WISDOM TOOTH EXTRACTION     2008   Social History   Socioeconomic History   Marital status: Married    Spouse name: Not on file   Number of children: Not on file   Years of education: Not on file   Highest education level: Not on file  Occupational History   Not on file  Tobacco Use   Smoking status: Never   Smokeless tobacco: Current    Types: Chew  Substance and Sexual Activity   Alcohol use: Yes    Comment: 1-2 occasionally    Drug use: No   Sexual activity: Not Currently  Other Topics Concern   Not on file  Social History Narrative   Not on file   Social Determinants of Health   Financial Resource Strain: Not on file  Food Insecurity: Not on file  Transportation Needs: Not on file  Physical Activity: Not on file  Stress: Not on file  Social Connections: Not on file  Intimate Partner Violence: Not on file   Family Status  Relation Name Status   Mother  Alive   Father  Alive   Sister  Alive   Brother  Alive   Son  Alive   Sister  Alive   Family History  Problem Relation Age of Onset   Prostate cancer Father    Healthy Son    No Known Allergies   Medications: Outpatient Medications Prior to Visit  Medication Sig   BD DISP NEEDLES  18G X 1-1/2" MISC 1 MG BY DOES NOT APPLY ROUTE EVERY 14 (FOURTEEN) DAYS.   BD DISP NEEDLES 21G X 1-1/2" MISC 1 MG BY DOES NOT APPLY ROUTE EVERY 14 (FOURTEEN) DAYS.   lisinopril (ZESTRIL) 40 MG tablet TAKE 1 TABLET BY MOUTH EVERY DAY   meloxicam (MOBIC) 15 MG tablet TAKE 1 TABLET (15 MG TOTAL) BY MOUTH DAILY.   Multiple Vitamin (MULTIVITAMIN PO) Take by mouth.   Syringe, Disposable, (2-3CC SYRINGE) 3 ML MISC 1 mg by Does not apply route every 14 (fourteen) days. 0.75 cc every 7 days   testosterone cypionate (DEPOTESTOSTERONE CYPIONATE) 200 MG/ML injection INJECT 0.75 ML EVERY 7 DAYS INTO MUSCLE   tiZANidine (ZANAFLEX) 4 MG tablet TAKE 1 TABLET BY MOUTH AT BEDTIME.   No facility-administered medications prior to visit.    Review of Systems  {Labs  Heme  Chem  Endocrine  Serology  Results Review (optional):23779}  Objective    There were no vitals taken for this visit. {Show previous vital signs (optional):23777}   Physical Exam  ***  Last depression screening scores    10/15/2022    8:22 AM 04/15/2022    8:46 AM 05/13/2019   10:10 AM  PHQ 2/9 Scores  PHQ -  2 Score 0 0 0  PHQ- 9 Score 0 0 2    Last fall risk screening    10/15/2022    8:22 AM  Fall Risk   Falls in the past year? 0  Number falls in past yr: 0  Injury with Fall? 0  Risk for fall due to : No Fall Risks  Follow up Falls evaluation completed    Last Audit-C alcohol use screening    10/15/2022    8:22 AM  Alcohol Use Disorder Test (AUDIT)  1. How often do you have a drink containing alcohol? 4  2. How many drinks containing alcohol do you have on a typical day when you are drinking? 1  3. How often do you have six or more drinks on one occasion? 2  AUDIT-C Score 7   A score of 3 or more in women, and 4 or more in men indicates increased risk for alcohol abuse, EXCEPT if all of the points are from question 1   No results found for any visits on 04/16/23.  Assessment & Plan    Routine Health Maintenance  and Physical Exam  Immunization History  Administered Date(s) Administered   Hepatitis B 07/14/1996, 08/18/1996, 01/10/1997   Influenza,inj,Quad PF,6+ Mos 07/12/2021   Influenza-Unspecified 07/07/2018   Janssen (J&J) SARS-COV-2 Vaccination 07/28/2020   Tdap 02/08/2020    Health Maintenance  Topic Date Due   INFLUENZA VACCINE  05/08/2023   DTaP/Tdap/Td (2 - Td or Tdap) 02/07/2030   Hepatitis C Screening  Completed   HIV Screening  Completed   HPV VACCINES  Aged Out   COVID-19 Vaccine  Discontinued    Problem List Items Addressed This Visit   None    No follow-ups on file.       Ronnald Ramp, MD  [facility] [phone] (phone) [phone] (fax)  [facility]

## 2023-04-16 ENCOUNTER — Encounter: Payer: Self-pay | Admitting: Family Medicine

## 2023-04-16 ENCOUNTER — Ambulatory Visit (INDEPENDENT_AMBULATORY_CARE_PROVIDER_SITE_OTHER): Payer: 59 | Admitting: Family Medicine

## 2023-04-16 VITALS — BP 152/88 | HR 69 | Ht 70.0 in | Wt 220.7 lb

## 2023-04-16 DIAGNOSIS — Z131 Encounter for screening for diabetes mellitus: Secondary | ICD-10-CM

## 2023-04-16 DIAGNOSIS — I10 Essential (primary) hypertension: Secondary | ICD-10-CM

## 2023-04-16 DIAGNOSIS — E6609 Other obesity due to excess calories: Secondary | ICD-10-CM | POA: Diagnosis not present

## 2023-04-16 DIAGNOSIS — Z0000 Encounter for general adult medical examination without abnormal findings: Secondary | ICD-10-CM | POA: Diagnosis not present

## 2023-04-16 DIAGNOSIS — G5693 Unspecified mononeuropathy of bilateral upper limbs: Secondary | ICD-10-CM | POA: Insufficient documentation

## 2023-04-16 DIAGNOSIS — H01136 Eczematous dermatitis of left eye, unspecified eyelid: Secondary | ICD-10-CM

## 2023-04-16 DIAGNOSIS — Z6831 Body mass index (BMI) 31.0-31.9, adult: Secondary | ICD-10-CM

## 2023-04-16 DIAGNOSIS — D179 Benign lipomatous neoplasm, unspecified: Secondary | ICD-10-CM

## 2023-04-16 DIAGNOSIS — Z13220 Encounter for screening for lipoid disorders: Secondary | ICD-10-CM

## 2023-04-16 NOTE — Assessment & Plan Note (Signed)
Chronic conditions are stable  Patient was counseled on benefits of regular physical activity with goal of 150 minutes of moderate to vigurous intensity 4 days per week  Patient was counseled to consume well balanced diet of fruits, vegetables, limited saturated fats and limited sugary foods and beverages with emphasis on consuming 6-8 glasses of water daily  Screening recommended today: A1c, lipids,CMP and CBC ordered

## 2023-04-16 NOTE — Assessment & Plan Note (Signed)
-  Order labs for annual physical, including renal function, liver function, electrolytes, diabetes screening, and cholesterol levels. -Continue regular exercise and healthy diet. -Continue testosterone management with urology.

## 2023-04-16 NOTE — Assessment & Plan Note (Addendum)
elevated blood pressure (161/100) likely due to missed Lisinopril doses for three days, poor sleep, and caffeine intake. Patient reports usual home readings around 130/90. -Chronic, previously at goal  -elevated today in setting of not having medication -Resume Lisinopril 40mg  daily. -Recheck blood pressure after medication is resumed.

## 2023-04-16 NOTE — Assessment & Plan Note (Signed)
Acute  New problem involving upper extremities  Recommended CBC, folate and B12 labs to assess for deficiencies  Recommended avoiding positioning arms in positions with static weight (sleeping on arms)

## 2023-04-16 NOTE — Assessment & Plan Note (Signed)
Chronic  Stable  Intermittent  Continue PRN steroid topical

## 2023-04-16 NOTE — Patient Instructions (Signed)
We will follow up with results of labs once they are available.    Congratulations on quitting the smokeless tobacco!   Please see me in 1 month for blood pressure follow up

## 2023-04-16 NOTE — Assessment & Plan Note (Signed)
Chronic  Stable  No symptoms  Monitoring for changes

## 2023-04-17 ENCOUNTER — Encounter: Payer: 59 | Admitting: Family Medicine

## 2023-04-17 LAB — CMP14+EGFR
ALT: 28 IU/L (ref 0–44)
AST: 28 IU/L (ref 0–40)
Albumin: 4.8 g/dL (ref 4.1–5.1)
Alkaline Phosphatase: 60 IU/L (ref 44–121)
BUN/Creatinine Ratio: 12 (ref 9–20)
BUN: 15 mg/dL (ref 6–20)
Bilirubin Total: 1.2 mg/dL (ref 0.0–1.2)
CO2: 25 mmol/L (ref 20–29)
Calcium: 9.7 mg/dL (ref 8.7–10.2)
Chloride: 101 mmol/L (ref 96–106)
Creatinine, Ser: 1.24 mg/dL (ref 0.76–1.27)
Globulin, Total: 2.2 g/dL (ref 1.5–4.5)
Glucose: 91 mg/dL (ref 70–99)
Potassium: 4.3 mmol/L (ref 3.5–5.2)
Sodium: 140 mmol/L (ref 134–144)
Total Protein: 7 g/dL (ref 6.0–8.5)
eGFR: 77 mL/min/{1.73_m2} (ref >59)

## 2023-04-17 LAB — HEMOGLOBIN A1C
Est. average glucose Bld gHb Est-mCnc: 108 mg/dL
Hgb A1c MFr Bld: 5.4 % (ref 4.8–5.6)

## 2023-04-17 LAB — B12 AND FOLATE PANEL
Folate: >20 ng/mL (ref >3.0)
Vitamin B-12: 782 pg/mL (ref 232–1245)

## 2023-04-17 LAB — LIPID PANEL
Chol/HDL Ratio: 5.4 ratio — ABNORMAL HIGH (ref 0.0–5.0)
Cholesterol, Total: 241 mg/dL — ABNORMAL HIGH (ref 100–199)
HDL: 45 mg/dL (ref >39)
LDL Chol Calc (NIH): 173 mg/dL — ABNORMAL HIGH (ref 0–99)
Triglycerides: 127 mg/dL (ref 0–149)
VLDL Cholesterol Cal: 23 mg/dL (ref 5–40)

## 2023-04-17 LAB — CBC
Hematocrit: 40.3 % (ref 37.5–51.0)
Hemoglobin: 12.9 g/dL — ABNORMAL LOW (ref 13.0–17.7)
MCH: 24.6 pg — ABNORMAL LOW (ref 26.6–33.0)
MCHC: 32 g/dL (ref 31.5–35.7)
MCV: 77 fL — ABNORMAL LOW (ref 79–97)
Platelets: 267 10*3/uL (ref 150–450)
RBC: 5.24 x10E6/uL (ref 4.14–5.80)
RDW: 14.7 % (ref 11.6–15.4)
WBC: 5.6 10*3/uL (ref 3.4–10.8)

## 2023-05-18 ENCOUNTER — Other Ambulatory Visit: Payer: Self-pay | Admitting: Family Medicine

## 2023-05-18 DIAGNOSIS — I10 Essential (primary) hypertension: Secondary | ICD-10-CM

## 2023-05-19 ENCOUNTER — Encounter: Payer: Self-pay | Admitting: Family Medicine

## 2023-05-19 ENCOUNTER — Ambulatory Visit: Payer: 59 | Admitting: Family Medicine

## 2023-05-19 VITALS — BP 136/86 | HR 73 | Ht 69.0 in | Wt 222.7 lb

## 2023-05-19 DIAGNOSIS — H01136 Eczematous dermatitis of left eye, unspecified eyelid: Secondary | ICD-10-CM | POA: Diagnosis not present

## 2023-05-19 DIAGNOSIS — I10 Essential (primary) hypertension: Secondary | ICD-10-CM

## 2023-05-19 MED ORDER — NYSTATIN-TRIAMCINOLONE 100000-0.1 UNIT/GM-% EX OINT
1.0000 | TOPICAL_OINTMENT | Freq: Two times a day (BID) | CUTANEOUS | 0 refills | Status: AC
Start: 2023-05-19 — End: ?

## 2023-05-19 MED ORDER — HYDROCHLOROTHIAZIDE 25 MG PO TABS
25.0000 mg | ORAL_TABLET | Freq: Every day | ORAL | 3 refills | Status: DC
Start: 2023-05-19 — End: 2023-11-17

## 2023-05-19 NOTE — Assessment & Plan Note (Signed)
Despite adherence to Lisinopril 40mg  daily, blood pressure remains elevated (136/86). No symptoms of hypertensive urgency/emergency reported. Discussed the need for additional antihypertensive therapy. -chronic, uncontrolled, not at goal of less than 130/80 -Add Hydrochlorothiazide 25mg  daily. -Check blood pressure in 2-3 weeks to assess response to therapy, patient expresses dissatisfaction with our previous visit and plans to transition to new clinic -advised patient to follow up for blood pressure with myself or new provider if he is able to establish care in the next few weeks, patient voiced understanding and was in agreement

## 2023-05-19 NOTE — Progress Notes (Addendum)
Established patient visit   Patient: John Murray   DOB: 23-Jan-1986   37 y.o. Male  MRN: [id number] Visit Date: 05/19/2023  Today's healthcare provider: Ronnald Ramp, MD   Chief Complaint  Patient presents with   Medical Management of Chronic Issues    1 month follow up on HTN, Would like to discuss Numbness in the arms and crusty eyelids, has been a problem for 3-6 months, prior treatment is Nystatin, symptoms are red and crusty, right over left, Blood pressure has been elevated for the last  months    Subjective     HPI     Medical Management of Chronic Issues    Additional comments: 1 month follow up on HTN, Would like to discuss Numbness in the arms and crusty eyelids, has been a problem for 3-6 months, prior treatment is Nystatin, symptoms are red and crusty, right over left, Blood pressure has been elevated for the last  months       Last edited by Rolly Salter, CMA on 05/19/2023  8:28 AM.       Discussed the use of AI scribe software for clinical note transcription with the patient, who gave verbal consent to proceed.  History of Present Illness   The patient, with a history of hypertension, has been monitoring his blood pressure at home while taking Lisinopril 40 mg. Prior to a nine-day trip, the patient recorded a blood pressure of 130/90. He denies any symptoms of chest pain, headache, or blurry vision and reports no issues with medication adherence.  The patient also reports a recurring issue with his eyelids, which have been red and crusty for the past three to six months. The right side is more affected than the left. The condition is occasionally itchy, and the patient has been using Nystatin to manage it. The symptoms usually resolve after two to three days of treatment, but they reappear after two to three weeks.  The patient also expressed dissatisfaction with the communication regarding his BMI and the classification of class one obesity in  his medical records. He felt that this was not properly communicated to him during his previous visit.       Medications: Outpatient Medications Prior to Visit  Medication Sig   BD DISP NEEDLES 18G X 1-1/2" MISC 1 MG BY DOES NOT APPLY ROUTE EVERY 14 (FOURTEEN) DAYS.   BD DISP NEEDLES 21G X 1-1/2" MISC 1 MG BY DOES NOT APPLY ROUTE EVERY 14 (FOURTEEN) DAYS.   lisinopril (ZESTRIL) 40 MG tablet TAKE 1 TABLET BY MOUTH EVERY DAY   meloxicam (MOBIC) 15 MG tablet TAKE 1 TABLET (15 MG TOTAL) BY MOUTH DAILY. (Patient taking differently: Take 15 mg by mouth daily. Taking as needed)   Multiple Vitamin (MULTIVITAMIN PO) Take by mouth.   Syringe, Disposable, (2-3CC SYRINGE) 3 ML MISC 1 mg by Does not apply route every 14 (fourteen) days. 0.75 cc every 7 days   testosterone cypionate (DEPOTESTOSTERONE CYPIONATE) 200 MG/ML injection INJECT 0.75 ML EVERY 7 DAYS INTO MUSCLE   tiZANidine (ZANAFLEX) 4 MG tablet TAKE 1 TABLET BY MOUTH AT BEDTIME. (Patient taking differently: Take 4 mg by mouth at bedtime. Taking as needed)   No facility-administered medications prior to visit.    Review of Systems      Objective    BP (!) 141/103 (BP Location: Left Arm, Patient Position: Sitting, Cuff Size: Large)   Pulse 73   Ht 5\' 9"  (1.753 m)   Wt  222 lb 11.2 oz (101 kg)   SpO2 100%   BMI 32.89 kg/m     Physical Exam Vitals reviewed.  Constitutional:      General: He is not in acute distress.    Appearance: Normal appearance. He is not ill-appearing, toxic-appearing or diaphoretic.  Eyes:     General: No scleral icterus.       Left eye: No discharge.     Conjunctiva/sclera: Conjunctivae normal.     Comments: Erythematous macule inferior to right eye and mild erythema superior to left eye with fine scale visualized  Cardiovascular:     Rate and Rhythm: Normal rate and regular rhythm.     Pulses: Normal pulses.     Heart sounds: Normal heart sounds. No murmur heard.    No friction rub. No gallop.   Pulmonary:     Effort: Pulmonary effort is normal. No respiratory distress.     Breath sounds: Normal breath sounds. No stridor. No wheezing, rhonchi or rales.  Abdominal:     General: Bowel sounds are normal. There is no distension.     Palpations: Abdomen is soft.     Tenderness: There is no abdominal tenderness.  Musculoskeletal:     Right lower leg: No edema.     Left lower leg: No edema.  Skin:    Findings: No erythema or rash.  Neurological:     Mental Status: He is alert and oriented to person, place, and time.       No results found for any visits on 05/19/23.  Assessment & Plan     Problem List Items Addressed This Visit     Eyelid dermatitis, eczematous, left    Recurrent episodes of red, crusty eyelids, more pronounced on the right. Symptoms improve with Nystatin cream but recur after discontinuation. -Chronic, intermittent problem  -Prescribe Nystatin and Triamcinolone cream, apply twice daily for 4-5 days during flare-ups.       Relevant Medications   nystatin-triamcinolone ointment (MYCOLOG)   HTN (hypertension) - Primary    Despite adherence to Lisinopril 40mg  daily, blood pressure remains elevated (136/86). No symptoms of hypertensive urgency/emergency reported. Discussed the need for additional antihypertensive therapy. -chronic, uncontrolled, not at goal of less than 130/80 -Add Hydrochlorothiazide 25mg  daily. -Check blood pressure in 2-3 weeks to assess response to therapy, patient expresses dissatisfaction with our previous visit and plans to transition to new clinic -advised patient to follow up for blood pressure with myself or new provider if he is able to establish care in the next few weeks, patient voiced understanding and was in agreement       Relevant Medications   hydrochlorothiazide (HYDRODIURIL) 25 MG tablet        Return in about 3 weeks (around 06/09/2023) for HTN.         Ronnald Ramp, MD  [facility] [phone] (phone) [phone] (fax)  [facility]

## 2023-05-19 NOTE — Assessment & Plan Note (Signed)
Recurrent episodes of red, crusty eyelids, more pronounced on the right. Symptoms improve with Nystatin cream but recur after discontinuation. -Chronic, intermittent problem  -Prescribe Nystatin and Triamcinolone cream, apply twice daily for 4-5 days during flare-ups.

## 2023-05-19 NOTE — Progress Notes (Unsigned)
[facility] [address] [place] [address] Phone: [phone] Subjective:   John Murray, am serving as a scribe for Dr. Antoine Murray.  I'm seeing this patient by the request  of:  Murray, Makiera, MD  CC: Back and neck pain follow-up  [id number]  John Murray is a 37 y.o. male coming in with complaint of back and neck pain. OMT 03/12/2023. Patient states same per usual. No new concerns.  Medications patient has been prescribed: Meloxicam, Zanaflex  Taking:     Patient did have x-rays at last exam including lumbar and thoracic.  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    Reviewed prior external information including notes and imaging from previsou exam, outside providers and external EMR if available.   As well as notes that were available from care everywhere and other healthcare systems.  Past medical history, social, surgical and family history all reviewed in electronic medical record.  No pertanent information unless stated regarding to the chief complaint.   Past Medical History:  Diagnosis Date   Allergy 2002   Seasonal and cats   Eustachian tube dysfunction    History of cellulitis    Hypertension 05-2021   Corrected by medication    No Known Allergies   Review of Systems:  No headache, visual changes, nausea, vomiting, diarrhea, constipation, dizziness, abdominal pain, skin rash, fevers, chills, night sweats, weight loss, swollen lymph nodes, body aches, joint swelling, chest pain, shortness of breath, mood changes. POSITIVE muscle aches  Objective  Blood pressure 128/86, pulse 81, height 5\' 9"  (1.753 m), weight 224 lb (101.6 kg), SpO2 97%.   General: No apparent distress alert and oriented x3 mood and affect normal, dressed appropriately.  HEENT: Pupils equal, extraocular movements intact   Respiratory: Patient's speak in full sentences and does not appear short of breath  Cardiovascular: No lower extremity edema, non tender, no erythema  Gait MSK:  Back low back does have some unfortunately tenderness to palpation over the sacroiliac joint on the right side.  Patient's left side has some mild tightness of the hip flexor mostly near the L2-L3 area.  Negative straight leg test.  Some limited range of motion in certain areas.  Osteopathic findings  T9 extended rotated and side bent left L1 flexed rotated and side bent left Sacrum right on right       Assessment and Plan:  Low back pain Patient is doing relatively well at this moment.  Discussed icing regimen from exercises.  Patient was doing some traveling and likely bring some things a little more tight but nothing that is stopping him from activity.  Follow-up with me again in 3 months.    Nonallopathic problems  Decision today to treat with OMT was based on Physical Exam  After verbal consent patient was treated with HVLA, ME, FPR techniques in thoracic, lumbar, and sacral  areas  Patient tolerated the procedure well with improvement in symptoms  Patient given exercises, stretches and lifestyle modifications  See medications in patient instructions if given  Patient will follow up in 12 weeks      The above documentation has been reviewed and is accurate and complete John Saa, DO        Note: This dictation was prepared with Dragon dictation along with smaller phrase technology. Any transcriptional errors that result from this process  are unintentional.

## 2023-05-20 NOTE — Telephone Encounter (Signed)
Requested Prescriptions  Pending Prescriptions Disp Refills   lisinopril (ZESTRIL) 40 MG tablet [Pharmacy Med Name: LISINOPRIL 40 MG TABLET] 90 tablet 1    Sig: TAKE 1 TABLET BY MOUTH EVERY DAY     Cardiovascular:  ACE Inhibitors Failed - 05/18/2023  8:51 AM      Failed - Last BP in normal range    BP Readings from Last 1 Encounters:  05/19/23 136/86         Passed - Cr in normal range and within 180 days    Creatinine, Ser  Date Value Ref Range Status  04/16/2023 1.24 0.76 - 1.27 mg/dL Final         Passed - K in normal range and within 180 days    Potassium  Date Value Ref Range Status  04/16/2023 4.3 3.5 - 5.2 mmol/L Final         Passed - Patient is not pregnant      Passed - Valid encounter within last 6 months    Recent Outpatient Visits           Yesterday Primary hypertension   [place] [facility] Simmons-Robinson, [place], MD   1 month ago Annual physical exam   [place] [facility] Simmons-Robinson, [place], MD   7 months ago Hypertension, unspecified type   [place] [facility] Simmons-Robinson, Tawanna Cool[place], MD   1 year ago Annual physical exam   [place] [facility] Bosie Clos, MD   1 year ago Acute pain of right shoulder   [place] [facility] Bosie Clos, MD       Future Appointments             Tomorrow Judi Saa, DO [place]   In 1 month McGowan, Elana [facility]

## 2023-05-21 ENCOUNTER — Encounter: Payer: Self-pay | Admitting: Family Medicine

## 2023-05-21 ENCOUNTER — Ambulatory Visit: Payer: 59 | Admitting: Family Medicine

## 2023-05-21 VITALS — BP 128/86 | HR 81 | Ht 69.0 in | Wt 224.0 lb

## 2023-05-21 DIAGNOSIS — M545 Low back pain, unspecified: Secondary | ICD-10-CM

## 2023-05-21 DIAGNOSIS — M9904 Segmental and somatic dysfunction of sacral region: Secondary | ICD-10-CM | POA: Diagnosis not present

## 2023-05-21 DIAGNOSIS — M9902 Segmental and somatic dysfunction of thoracic region: Secondary | ICD-10-CM

## 2023-05-21 DIAGNOSIS — G8929 Other chronic pain: Secondary | ICD-10-CM | POA: Diagnosis not present

## 2023-05-21 DIAGNOSIS — M9903 Segmental and somatic dysfunction of lumbar region: Secondary | ICD-10-CM | POA: Diagnosis not present

## 2023-05-21 NOTE — Patient Instructions (Signed)
Glad you had you're trip Get back into your routine See you again in 3 months

## 2023-05-21 NOTE — Assessment & Plan Note (Signed)
Patient is doing relatively well at this moment.  Discussed icing regimen from exercises.  Patient was doing some traveling and likely bring some things a little more tight but nothing that is stopping him from activity.  Follow-up with me again in 3 months.

## 2023-06-16 ENCOUNTER — Other Ambulatory Visit: Payer: Self-pay

## 2023-06-16 DIAGNOSIS — E291 Testicular hypofunction: Secondary | ICD-10-CM

## 2023-06-17 ENCOUNTER — Other Ambulatory Visit: Payer: 59

## 2023-06-17 DIAGNOSIS — E291 Testicular hypofunction: Secondary | ICD-10-CM

## 2023-06-18 LAB — HEMOGLOBIN AND HEMATOCRIT, BLOOD
Hematocrit: 44.8 % (ref 37.5–51.0)
Hemoglobin: 14.4 g/dL (ref 13.0–17.7)

## 2023-06-18 LAB — TESTOSTERONE: Testosterone: 835 ng/dL (ref 264–916)

## 2023-06-18 LAB — PSA: Prostate Specific Ag, Serum: 0.8 ng/mL (ref 0.0–4.0)

## 2023-06-23 NOTE — Progress Notes (Unsigned)
06/24/23 11:48 AM   John Murray Feb 21, 1986 [id number]  Referring provider:  Ronnald Ramp, MD [address] [place],  [place] [address]  Urological History: 1. Hypogonadism -contributing factors of high BMI -testosterone level (06/2023) 835 -Hemoglobin/hematocrit (06/2023)  14.4/44.8 -testosterone cypionate 200mg /mL, 0.5 cc every 7 days   2. Erectile dysfunction:  -Contributing factors of testosterone deficiency, hypertension, alcohol consumption and hyperlipidemia  Follow-up   HPI: John Murray is a 37 y.o.male who presents today for yearly follow up.    PSA (06/2023) 0.8   I PSS 0/0  Patient denies any modifying or aggravating factors.  Patient denies any recent UTI's, gross hematuria, dysuria or suprapubic/flank pain.  Patient denies any fevers, chills, nausea or vomiting.     IPSS     Row Name 06/24/23 1100         International Prostate Symptom Score   How often have you had the sensation of not emptying your bladder? Not at All     How often have you had to urinate less than every two hours? Not at All     How often have you found you stopped and started again several times when you urinated? Not at All     How often have you found it difficult to postpone urination? Not at All     How often have you had a weak urinary stream? Not at All     How often have you had to strain to start urination? Not at All     How many times did you typically get up at night to urinate? None     Total IPSS Score 0       Quality of Life due to urinary symptoms   If you were to spend the rest of your life with your urinary condition just the way it is now how would you feel about that? Delighted               Score:  1-7 Mild 8-19 Moderate 20-35 Severe   SHIM 23  Having some mild ED.  Patient still having spontaneous erections.  He denies any pain or curvature with erections.      SHIM     Row Name 06/24/23 1122         SHIM: Over  the last 6 months:   How do you rate your confidence that you could get and keep an erection? Moderate     When you had erections with sexual stimulation, how often were your erections hard enough for penetration (entering your partner)? Almost Always or Always     During sexual intercourse, how often were you able to maintain your erection after you had penetrated (entered) your partner? Almost Always or Always     During sexual intercourse, how difficult was it to maintain your erection to completion of intercourse? Not Difficult     When you attempted sexual intercourse, how often was it satisfactory for you? Almost Always or Always       SHIM Total Score   SHIM 23               Score: 1-7 Severe ED 8-11 Moderate ED 12-16 Mild-Moderate ED 17-21 Mild ED 22-25 No ED   PMH: Past Medical History:  Diagnosis Date   Allergy 2002   Seasonal and cats   Eustachian tube dysfunction    History of cellulitis    Hypertension 05-2021   Corrected by medication  Surgical History: Past Surgical History:  Procedure Laterality Date   WISDOM TOOTH EXTRACTION     2008    Home Medications:  Allergies as of 06/24/2023   No Known Allergies      Medication List        Accurate as of June 24, 2023 11:48 AM. If you have any questions, ask your nurse or doctor.          2-3CC SYRINGE 3 ML Misc 1 mg by Does not apply route every 14 (fourteen) days. 0.75 cc every 7 days   BD Disp Needles 18G X 1-1/2" Misc Generic drug: NEEDLE (DISP) 18 G 1 mg by Does not apply route every 14 (fourteen) days.   BD Disp Needles 21G X 1-1/2" Misc Generic drug: NEEDLE (DISP) 21 G 1 mg by Does not apply route every 14 (fourteen) days.   hydrochlorothiazide 25 MG tablet Commonly known as: HYDRODIURIL Take 1 tablet (25 mg total) by mouth daily.   lisinopril 40 MG tablet Commonly known as: ZESTRIL TAKE 1 TABLET BY MOUTH EVERY DAY   meloxicam 15 MG tablet Commonly known as: MOBIC TAKE  1 TABLET (15 MG TOTAL) BY MOUTH DAILY. What changed: additional instructions   MULTIVITAMIN PO Take by mouth.   nystatin-triamcinolone ointment Commonly known as: MYCOLOG Apply 1 Application topically 2 (two) times daily.   testosterone cypionate 200 MG/ML injection Commonly known as: DEPOTESTOSTERONE CYPIONATE INJECT 0.75 ML EVERY 7 DAYS INTO MUSCLE   tiZANidine 4 MG tablet Commonly known as: ZANAFLEX TAKE 1 TABLET BY MOUTH AT BEDTIME. What changed: additional instructions        Allergies: No Known Allergies  Family History: Family History  Problem Relation Age of Onset   Hypertension Mother    Prostate cancer Father    Cancer Father    Healthy Son    Heart disease Maternal Grandfather    Hypertension Maternal Grandfather    Heart disease Maternal Grandmother     Social History:  reports that he has quit smoking. His smokeless tobacco use includes chew. He reports current alcohol use of about 14.0 standard drinks of alcohol per week. He reports that he does not use drugs.   Physical Exam: BP (!) 155/92   Pulse 78   Ht 5\' 9"  (1.753 m)   Wt 224 lb (101.6 kg)   BMI 33.08 kg/m   Constitutional:  Well nourished. Alert and oriented, No acute distress. HEENT: [place] AT, moist mucus membranes.  Trachea midline Cardiovascular: No clubbing, cyanosis, or edema. Respiratory: Normal respiratory effort, no increased work of breathing. Neurologic: Grossly intact, no focal deficits, moving all 4 extremities. Psychiatric: Normal mood and affect.   Laboratory Data:    Latest Ref Rng & Units 04/16/2023    2:28 PM 04/17/2022    8:12 AM 06/15/2021    8:34 AM  CMP  Glucose 70 - 99 mg/dL 91  92  97   BUN 6 - 20 mg/dL 15  15  18    Creatinine 0.76 - 1.27 mg/dL 2.59  5.63  8.75   Sodium 134 - 144 mmol/L 140  141  140   Potassium 3.5 - 5.2 mmol/L 4.3  4.6  4.5   Chloride 96 - 106 mmol/L 101  105  100   CO2 20 - 29 mmol/L 25  20  21    Calcium 8.7 - 10.2 mg/dL 9.7  9.5  9.7   Total  Protein 6.0 - 8.5 g/dL 7.0  6.6  7.0   Total  Bilirubin 0.0 - 1.2 mg/dL 1.2  0.9  1.5   Alkaline Phos 44 - 121 IU/L 60  57  57   AST 0 - 40 IU/L 28  20  24    ALT 0 - 44 IU/L 28  24  31         Latest Ref Rng & Units 06/17/2023    8:20 AM 04/16/2023    2:28 PM 12/31/2022    8:11 AM  CBC  WBC 3.4 - 10.8 x10E3/uL  5.6    Hemoglobin 13.0 - 17.7 g/dL 30.8  65.7  84.6   Hematocrit 37.5 - 51.0 % 44.8  40.3  46.3   Platelets 150 - 450 x10E3/uL  267      Results for orders placed or performed in visit on 06/17/23  PSA  Result Value Ref Range   Prostate Specific Ag, Serum 0.8 0.0 - 4.0 ng/mL  Testosterone  Result Value Ref Range   Testosterone 835 264 - 916 ng/dL  Hemoglobin and Hematocrit, Blood  Result Value Ref Range   Hemoglobin 14.4 13.0 - 17.7 g/dL   Hematocrit 96.2 95.2 - 51.0 %   I have reviewed the labs.  See HPI.      Assessment & Plan:    1. Testosterone deficiency  -testosterone levels are therapeutic -H & H WNL -Continue testosterone cypionate 200 mg/milliliters, 0.75 cc every 7 days  2. Erectile dysfunction:    -Testosterone level is therapeutic at this time -mild ed, not to the point where he would want to try PDE 5 inhibitor at this time    Return in about 6 months (around 12/22/2023) for PSA, testosterone (one week after injection) H & H -only .   Cloretta Ned   [facility] [address] [place], [place] [address] [phone]

## 2023-06-24 ENCOUNTER — Ambulatory Visit: Payer: 59 | Admitting: Urology

## 2023-06-24 ENCOUNTER — Encounter: Payer: Self-pay | Admitting: Urology

## 2023-06-24 VITALS — BP 155/92 | HR 78 | Ht 69.0 in | Wt 224.0 lb

## 2023-06-24 DIAGNOSIS — E291 Testicular hypofunction: Secondary | ICD-10-CM

## 2023-06-24 DIAGNOSIS — N529 Male erectile dysfunction, unspecified: Secondary | ICD-10-CM

## 2023-06-24 DIAGNOSIS — R351 Nocturia: Secondary | ICD-10-CM

## 2023-06-24 MED ORDER — TESTOSTERONE CYPIONATE 200 MG/ML IM SOLN
INTRAMUSCULAR | 0 refills | Status: DC
Start: 2023-06-24 — End: 2023-10-15

## 2023-06-24 MED ORDER — "BD DISP NEEDLES 21G X 1-1/2"" MISC": 1.0000 mg | 24 refills | Status: AC

## 2023-06-24 MED ORDER — SYRINGE 2-3 ML 3 ML MISC
1.0000 mg | 3 refills | Status: AC
Start: 2023-06-24 — End: ?

## 2023-06-24 MED ORDER — "BD DISP NEEDLES 18G X 1-1/2"" MISC": 1.0000 mg | 2 refills | Status: AC

## 2023-08-14 NOTE — Progress Notes (Signed)
  [name] [address] [place] [address] Phone: [phone] Subjective:   [name], am serving as a scribe for Dr. Antoine Primas.  I'm seeing this patient by the request  of:  Simmons-Robinson, Makiera, MD  CC: back and neck pain follow up   [id number]  John Murray is a 37 y.o. male coming in with complaint of back and neck pain. OMT 05/21/2023. Patient states same per usual. No new concerns.  Medications patient has been prescribed: Zanaflex, Meloxicam  Taking:         Reviewed prior external information including notes and imaging from previsou exam, outside providers and external EMR if available.   As well as notes that were available from care everywhere and other healthcare systems.  Past medical history, social, surgical and family history all reviewed in electronic medical record.  No pertanent information unless stated regarding to the chief complaint.   Past Medical History:  Diagnosis Date   Allergy 2002   Seasonal and cats   Eustachian tube dysfunction    History of cellulitis    Hypertension 05-2021   Corrected by medication    No Known Allergies   Review of Systems:  No headache, visual changes, nausea, vomiting, diarrhea, constipation, dizziness, abdominal pain, skin rash, fevers, chills, night sweats, weight loss, swollen lymph nodes, body aches, joint swelling, chest pain, shortness of breath, mood changes. POSITIVE muscle aches  Objective  Blood pressure 118/84, pulse 70, height 5\' 9"  (1.753 m), weight 224 lb (101.6 kg), SpO2 97%.   General: No apparent distress alert and oriented x3 mood and affect normal, dressed appropriately.  HEENT: Pupils equal, extraocular movements intact  Respiratory: Patient's speak in full sentences and does not appear short of breath  Cardiovascular: No lower extremity edema, non tender, no erythema  Gait MSK:  Back low back does have some loss of lordosis tightness  with FABER right greater than left.  Some seems to be tighter parascapular  Osteopathic findings  T9 extended rotated and side bent left L2 flexed rotated and side bent left Sacrum right on right    Assessment and Plan:  Low back pain Patient low back still getting some difficulty.  Does do some lifting that seems to sometimes cause some aggravation.  Do think that this was an acute on chronic.  Discussed which activities to do and which ones to avoid.  Follow-up with me again in 6 to 8 weeks    Nonallopathic problems  Decision today to treat with OMT was based on Physical Exam  After verbal consent patient was treated with HVLA, ME, FPR techniques in thoracic, lumbar, and sacral  areas  Patient tolerated the procedure well with improvement in symptoms  Patient given exercises, stretches and lifestyle modifications  See medications in patient instructions if given  Patient will follow up in 4-8 weeks     The above documentation has been reviewed and is accurate and complete Judi Saa, DO         Note: This dictation was prepared with Dragon dictation along with smaller phrase technology. Any transcriptional errors that result from this process are unintentional.

## 2023-08-19 ENCOUNTER — Ambulatory Visit: Payer: 59 | Admitting: Family Medicine

## 2023-08-19 ENCOUNTER — Encounter: Payer: Self-pay | Admitting: Family Medicine

## 2023-08-19 VITALS — BP 118/84 | HR 70 | Ht 69.0 in | Wt 224.0 lb

## 2023-08-19 DIAGNOSIS — M545 Low back pain, unspecified: Secondary | ICD-10-CM | POA: Diagnosis not present

## 2023-08-19 DIAGNOSIS — M9903 Segmental and somatic dysfunction of lumbar region: Secondary | ICD-10-CM

## 2023-08-19 DIAGNOSIS — G8929 Other chronic pain: Secondary | ICD-10-CM

## 2023-08-19 DIAGNOSIS — M9902 Segmental and somatic dysfunction of thoracic region: Secondary | ICD-10-CM

## 2023-08-19 DIAGNOSIS — M9904 Segmental and somatic dysfunction of sacral region: Secondary | ICD-10-CM

## 2023-08-19 NOTE — Assessment & Plan Note (Signed)
Patient low back still getting some difficulty.  Does do some lifting that seems to sometimes cause some aggravation.  Do think that this was an acute on chronic.  Discussed which activities to do and which ones to avoid.  Follow-up with me again in 6 to 8 weeks

## 2023-08-19 NOTE — Patient Instructions (Signed)
Good luck with the [place] Thanks for the [place] tips See you again in 2 months

## 2023-10-02 ENCOUNTER — Ambulatory Visit: Payer: 59 | Admitting: Nurse Practitioner

## 2023-10-02 ENCOUNTER — Encounter: Payer: Self-pay | Admitting: Nurse Practitioner

## 2023-10-02 VITALS — BP 136/82 | HR 65 | Temp 98.2°F | Ht 69.0 in | Wt 223.8 lb

## 2023-10-02 DIAGNOSIS — M545 Low back pain, unspecified: Secondary | ICD-10-CM | POA: Diagnosis not present

## 2023-10-02 DIAGNOSIS — I10 Essential (primary) hypertension: Secondary | ICD-10-CM | POA: Diagnosis not present

## 2023-10-02 DIAGNOSIS — E291 Testicular hypofunction: Secondary | ICD-10-CM | POA: Diagnosis not present

## 2023-10-02 DIAGNOSIS — E782 Mixed hyperlipidemia: Secondary | ICD-10-CM

## 2023-10-02 DIAGNOSIS — H01136 Eczematous dermatitis of left eye, unspecified eyelid: Secondary | ICD-10-CM

## 2023-10-02 DIAGNOSIS — G8929 Other chronic pain: Secondary | ICD-10-CM

## 2023-10-02 NOTE — Assessment & Plan Note (Signed)
Their last lipid panel in July showed elevated total and LDL cholesterol, though they have been working on diet and exercise improvements. We plan to recheck cholesterol at the next physical in July and discussed the potential need for cholesterol medication if there are no further improvements with lifestyle modifications.

## 2023-10-02 NOTE — Assessment & Plan Note (Signed)
They are on Lisinopril and Hydrochlorothiazide, with variable home readings; however, today's office reading was 136/80 without symptoms of chest pain, shortness of breath, or dizziness. We will continue current medications and they should check blood pressure at home, monitoring for readings consistently over 140/90.

## 2023-10-02 NOTE — Assessment & Plan Note (Signed)
They have a history of flaky skin around the eyes, possibly eczema or dermatitis, and are using Nystatin/Triamcinolone cream as needed with good effect. We will continue current treatment as needed.

## 2023-10-02 NOTE — Assessment & Plan Note (Signed)
They are on injections for low testosterone, which has led to increased hemoglobin and hematocrit. We will continue current management with urology and regular blood donations every 8 weeks to manage hemoglobin levels.

## 2023-10-02 NOTE — Assessment & Plan Note (Signed)
Managed by Sports Medicine. Continue current medication regimen as needed. Follow up as scheduled.

## 2023-10-02 NOTE — Progress Notes (Signed)
Bethanie Dicker, NP-C Phone: [phone]  John Murray is a 37 y.o. male who presents today to establish care.   Discussed the use of AI scribe software for clinical note transcription with the patient, who gave verbal consent to proceed.  History of Present Illness   The patient, with a history of hypertension and low testosterone, has been managing these conditions with lisinopril, hydrochlorothiazide, and testosterone injections. They report no chest pain, shortness of breath, or dizziness. However, they have noticed fluctuations in their blood pressure readings at home, despite adherence to their medication regimen. They also regularly donate blood every eight weeks due to elevated hemoglobin levels associated with testosterone therapy.  The patient has also been dealing with a dermatological issue around their eyes, which they describe as flaky and itchy. They have been managing this with a combination of nystatin and triamcinolone, applying a small amount only when symptoms arise.  In terms of lifestyle, the patient is actively trying to improve their diet and exercise habits. They report avoiding fast food and fried foods, and regularly engage in physical activity. They have also started taking fish oil supplements. Despite these efforts, their cholesterol levels have been fluctuating, and they are aware that they may need to consider medication if diet and exercise do not sufficiently control their cholesterol levels.      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/16/2023   Annual physical exam 04/16/2023   Class 1 obesity due to excess calories with serious comorbidity and body mass index (BMI) of 31.0 to 31.9 in adult 04/16/2023   Hypogonadism in male 10/02/2023   Mixed hyperlipidemia  10/02/2023   Resolved Ambulatory Problems    Diagnosis Date Noted   Cellulitis 10/08/2016   Dysfunction of eustachian tube 10/08/2016   Past Medical History:  Diagnosis Date   Allergy 2002   Eustachian tube dysfunction    History of cellulitis    Hypertension 05-2021    Family History  Problem Relation Age of Onset   Hypertension Mother    Prostate cancer Father    Cancer Father    Healthy Son    Heart disease Maternal Grandfather    Hypertension Maternal Grandfather    Heart disease Maternal Grandmother     Social History   Socioeconomic History   Marital status: Married    Spouse name: Not on file   Number of children: Not on file   Years of education: Not on file   Highest education level: Some college, no degree  Occupational History   Not on file  Tobacco Use   Smoking status: Former   Smokeless tobacco: Current    Types: Chew   Tobacco comments:    Snuff  Substance and Sexual Activity   Alcohol use: Yes    Alcohol/week: 14.0 standard drinks of alcohol    Types: 12 Cans of beer, 2 Shots of liquor per week    Comment: 1-2 occasionally    Drug use: No   Sexual activity: Yes    Birth control/protection: Implant  Other Topics Concern   Not on file  Social History Narrative   Not on file   Social Drivers of Health   Financial Resource Strain: Low Risk  (09/28/2023)   Overall Financial Resource Strain (CARDIA)    Difficulty of Paying Living Expenses:  Not hard at all  Food Insecurity: No Food Insecurity (09/28/2023)   Hunger Vital Sign    Worried About Running Out of Food in the Last Year: Never true    Ran Out of Food in the Last Year: Never true  Transportation Needs: No Transportation Needs (09/28/2023)   PRAPARE - Administrator, Civil Service (Medical): No    Lack of Transportation (Non-Medical): No  Physical Activity: Sufficiently Active (09/28/2023)   Exercise Vital Sign    Days of Exercise per Week: 4 days    Minutes of Exercise  per Session: 40 min  Stress: No Stress Concern Present (09/28/2023)   [employer] of Occupational Health - Occupational Stress Questionnaire    Feeling of Stress : Only a little  Social Connections: Moderately Integrated (09/28/2023)   Social Connection and Isolation Panel [NHANES]    Frequency of Communication with Friends and Family: Once a week    Frequency of Social Gatherings with Friends and Family: Twice a week    Attends Religious Services: 1 to 4 times per year    Active Member of [employer] or Organizations: No    Attends Engineer, structural: Not on file    Marital Status: Married  Catering manager Violence: Not on file    ROS  General:  Negative for unexplained weight loss, fever Skin: Negative for new or changing mole, sore that won't heal HEENT: Negative for trouble hearing, trouble seeing, ringing in ears, mouth sores, hoarseness, change in voice, dysphagia. CV:  Negative for chest pain, dyspnea, edema, palpitations Resp: Negative for cough, dyspnea, hemoptysis GI: Negative for nausea, vomiting, diarrhea, constipation, abdominal pain, melena, hematochezia. GU: Negative for dysuria, incontinence, urinary hesitance, hematuria, vaginal or penile discharge, polyuria, sexual difficulty, lumps in testicle or breasts MSK: Negative for muscle cramps or aches, joint pain or swelling Neuro: Negative for headaches, weakness, numbness, dizziness, passing out/fainting Psych: Negative for depression, anxiety, memory problems  Objective  Physical Exam Vitals:   10/02/23 0902 10/02/23 0907  BP: (!) 140/88 136/82  Pulse: 65   Temp: 98.2 F (36.8 C)   SpO2: 98%     BP Readings from Last 3 Encounters:  10/02/23 136/82  08/19/23 118/84  06/24/23 (!) 155/92   Wt Readings from Last 3 Encounters:  10/02/23 223 lb 12.8 oz (101.5 kg)  08/19/23 224 lb (101.6 kg)  06/24/23 224 lb (101.6 kg)    Physical Exam Constitutional:      General: He is not in acute  distress.    Appearance: Normal appearance.  HENT:     Head: Normocephalic.  Cardiovascular:     Rate and Rhythm: Normal rate and regular rhythm.     Heart sounds: Normal heart sounds.  Pulmonary:     Effort: Pulmonary effort is normal.     Breath sounds: Normal breath sounds.  Skin:    General: Skin is warm and dry.  Neurological:     General: No focal deficit present.     Mental Status: He is alert.  Psychiatric:        Mood and Affect: Mood normal.        Behavior: Behavior normal.     Assessment/Plan:   Primary hypertension Assessment & Plan: They are on Lisinopril and Hydrochlorothiazide, with variable home readings; however, today's office reading was 136/80 without symptoms of chest pain, shortness of breath, or dizziness. We will continue current medications and they should check blood pressure at home, monitoring for readings consistently over 140/90.  Mixed hyperlipidemia Assessment & Plan: Their last lipid panel in July showed elevated total and LDL cholesterol, though they have been working on diet and exercise improvements. We plan to recheck cholesterol at the next physical in July and discussed the potential need for cholesterol medication if there are no further improvements with lifestyle modifications.   Chronic right-sided low back pain without sciatica Assessment & Plan: Managed by Sports Medicine. Continue current medication regimen as needed. Follow up as scheduled.    Hypogonadism in male Assessment & Plan: They are on injections for low testosterone, which has led to increased hemoglobin and hematocrit. We will continue current management with urology and regular blood donations every 8 weeks to manage hemoglobin levels.   Eyelid dermatitis, eczematous, left Assessment & Plan: They have a history of flaky skin around the eyes, possibly eczema or dermatitis, and are using Nystatin/Triamcinolone cream as needed with good effect. We will continue  current treatment as needed.      Return in about 7 months (around 04/19/2024) for Annual Exam. Please complete fasting labs 2-3 days prior.   Bethanie Dicker, NP-C [facility]

## 2023-10-15 ENCOUNTER — Other Ambulatory Visit: Payer: Self-pay | Admitting: *Deleted

## 2023-10-15 DIAGNOSIS — E291 Testicular hypofunction: Secondary | ICD-10-CM

## 2023-10-16 MED ORDER — TESTOSTERONE CYPIONATE 200 MG/ML IM SOLN
INTRAMUSCULAR | 0 refills | Status: DC
Start: 2023-10-16 — End: 2024-01-20

## 2023-10-17 NOTE — Progress Notes (Signed)
 [facility] [address] [place] [address] Phone: [phone] Subjective:   John Murray, am serving as a scribe for Dr. Arthea Claudene.  I'm seeing this patient by the request  of:  Gretel App, NP  CC: Low back pain  [name]  John Murray is a 38 y.o. male coming in with complaint of back and neck pain. OMT 08/19/2023. Patient states that he has been tight the past few weeks.  Patient has had low back pain but only mild overall.  Nothing that stopping him from activity.  Does notice it though on a fairly regular basis and seem to worsen over the course of the last week.  Medications patient has been prescribed: None  Taking:         Reviewed prior external information including notes and imaging from previsou exam, outside providers and external EMR if available.   As well as notes that were available from care everywhere and other healthcare systems.  Past medical history, social, surgical and family history all reviewed in electronic medical record.  No pertanent information unless stated regarding to the chief complaint.   Past Medical History:  Diagnosis Date   Allergy 2002   Seasonal and cats   Eustachian tube dysfunction    History of cellulitis    Hypertension 05-2021   Corrected by medication    No Known Allergies   Review of Systems:  No headache, visual changes, nausea, vomiting, diarrhea, constipation, dizziness, abdominal pain, skin rash, fevers, chills, night sweats, weight loss, swollen lymph nodes, body aches, joint swelling, chest pain, shortness of breath, mood changes. POSITIVE muscle aches  Objective  Blood pressure 108/82, pulse 71, height 5' 9 (1.753 m), weight 230 lb (104.3 kg), SpO2 99%.   General: No apparent distress alert and oriented x3 mood and affect normal, dressed appropriately.  HEENT: Pupils equal, extraocular movements intact  Respiratory: Patient's speak in full sentences and does  not appear short of breath  Cardiovascular: No lower extremity edema, non tender, no erythema  Gait MSK:  Back does have some loss of lordosis noted.  Some tenderness to palpation noted.  Osteopathic findings  C2 flexed rotated and side bent right C6 flexed rotated and side bent left T3 extended rotated and side bent right inhaled rib T9 extended rotated and side bent left L2 flexed rotated and side bent right Sacrum right on right    Assessment and Plan:  Low back pain Discussed low back pain again.  Discussed home exercises, continue to work on core strength.  Overall patient is doing relatively well.  I do believe that patient does need follow-up again in 3 months.  Did respond well to osteopathic manipulation today.  No change in medications.    Nonallopathic problems  Decision today to treat with OMT was based on Physical Exam  After verbal consent patient was treated with HVLA, ME, FPR techniques in cervical, rib, thoracic, lumbar, and sacral  areas  Patient tolerated the procedure well with improvement in symptoms  Patient given exercises, stretches and lifestyle modifications  See medications in patient instructions if given  Patient will follow up in 4-8 weeks     The above documentation has been reviewed and is accurate and complete Xeng Kucher M Keri Veale, DO         Note: This dictation was prepared with Dragon dictation along with smaller phrase technology. Any transcriptional errors that result from this process are unintentional.

## 2023-10-21 ENCOUNTER — Ambulatory Visit: Payer: 59 | Admitting: Family Medicine

## 2023-10-21 ENCOUNTER — Encounter: Payer: Self-pay | Admitting: Family Medicine

## 2023-10-21 VITALS — BP 108/82 | HR 71 | Ht 69.0 in | Wt 230.0 lb

## 2023-10-21 DIAGNOSIS — M545 Low back pain, unspecified: Secondary | ICD-10-CM | POA: Diagnosis not present

## 2023-10-21 DIAGNOSIS — M9903 Segmental and somatic dysfunction of lumbar region: Secondary | ICD-10-CM

## 2023-10-21 DIAGNOSIS — M9904 Segmental and somatic dysfunction of sacral region: Secondary | ICD-10-CM

## 2023-10-21 DIAGNOSIS — G8929 Other chronic pain: Secondary | ICD-10-CM

## 2023-10-21 DIAGNOSIS — M9902 Segmental and somatic dysfunction of thoracic region: Secondary | ICD-10-CM | POA: Diagnosis not present

## 2023-10-21 DIAGNOSIS — M9908 Segmental and somatic dysfunction of rib cage: Secondary | ICD-10-CM | POA: Diagnosis not present

## 2023-10-21 DIAGNOSIS — M9901 Segmental and somatic dysfunction of cervical region: Secondary | ICD-10-CM

## 2023-10-21 NOTE — Patient Instructions (Signed)
 Avoid overhand lifting See me in 10-12 weeks

## 2023-10-21 NOTE — Assessment & Plan Note (Signed)
 Discussed low back pain again.  Discussed home exercises, continue to work on core strength.  Overall patient is doing relatively well.  I do believe that patient does need follow-up again in 3 months.  Did respond well to osteopathic manipulation today.  No change in medications.

## 2023-11-16 ENCOUNTER — Other Ambulatory Visit: Payer: Self-pay | Admitting: Family Medicine

## 2023-11-16 DIAGNOSIS — I10 Essential (primary) hypertension: Secondary | ICD-10-CM

## 2023-11-17 ENCOUNTER — Other Ambulatory Visit: Payer: Self-pay | Admitting: Nurse Practitioner

## 2023-11-17 DIAGNOSIS — I10 Essential (primary) hypertension: Secondary | ICD-10-CM

## 2023-11-17 MED ORDER — HYDROCHLOROTHIAZIDE 25 MG PO TABS: 25.0000 mg | ORAL_TABLET | Freq: Every day | ORAL | 3 refills | Status: AC

## 2023-11-17 MED ORDER — LISINOPRIL 40 MG PO TABS
40.0000 mg | ORAL_TABLET | Freq: Every day | ORAL | 1 refills | Status: DC
Start: 2023-11-17 — End: 2024-05-17

## 2023-11-17 NOTE — Telephone Encounter (Signed)
 NO longer under provider care Requested Prescriptions  Pending Prescriptions Disp Refills   lisinopril  (ZESTRIL ) 40 MG tablet [Pharmacy Med Name: LISINOPRIL  40 MG TABLET] 30 tablet 5    Sig: TAKE 1 TABLET BY MOUTH EVERY DAY     Cardiovascular:  ACE Inhibitors Failed - 11/17/2023  3:05 PM      Failed - Cr in normal range and within 180 days    Creatinine, Ser  Date Value Ref Range Status  04/16/2023 1.24 0.76 - 1.27 mg/dL Final         Failed - K in normal range and within 180 days    Potassium  Date Value Ref Range Status  04/16/2023 4.3 3.5 - 5.2 mmol/L Final         Failed - Valid encounter within last 6 months    Recent Outpatient Visits           6 months ago Primary hypertension   [place] [facility] Simmons-Robinson, Judyann Number, MD   7 months ago Annual physical exam   [facility] Simmons-Robinson, [place], MD   1 year ago Hypertension, unspecified type   [place] [facility] Mimi Alt, MD   1 year ago Annual physical exam   [place] [facility] Zorita Hiss., MD   2 years ago Acute pain of right shoulder   [place] [facility] Zorita Hiss., MD       Future Appointments             In 1 month Isidro Margo, DO [place] - Patient is not pregnant      Passed - Last BP in normal range    BP Readings from Last 1 Encounters:  10/21/23 108/82

## 2023-11-17 NOTE — Telephone Encounter (Signed)
 Copied from CRM [id number]. Topic: Clinical - Medication Refill >> Nov 17, 2023  1:48 PM Crist Dominion wrote: Most Recent Primary Care Visit:  Provider: Bluford Burkitt  Department: [facility]  Visit Type: NEW PATIENT  Date: 10/02/2023  Medication: lisinopril  (ZESTRIL ) 40 MG tablet // hydrochlorothiazide  (HYDRODIURIL ) 25 MG tablet  Has the patient contacted their pharmacy? Yes current prescriptions have the patients old provider listed at the prescribing provider, patient is requesting Bluford Burkitt to send in new ones. (Agent: If no, request that the patient contact the pharmacy for the refill. If patient does not wish to contact the pharmacy document the reason why and proceed with request.) (Agent: If yes, when and what did the pharmacy advise?)  Is this the correct pharmacy for this prescription? Yese If no, delete pharmacy and type the correct one.  This is the patient's preferred pharmacy:  [id number] [place] [facility] - [address] [place] [address] Phone: [phone] Fax: [phone]   Has the prescription been filled recently? Yes  Is the patient out of the medication? No  Has the patient been seen for an appointment in the last year OR does the patient have an upcoming appointment? Yes  Can we respond through MyChart? Yes  Agent: Please be advised that Rx refills may take up to 3 business days. We ask that you follow-up with your pharmacy.

## 2023-11-17 NOTE — Telephone Encounter (Signed)
 Last Fill: Hydrochlorothiazide : 05/19/23     Lisinopril : 05/20/23  Last OV: 10/02/23 Next OV: 04/13/24  Routing to provider for review/authorization.

## 2023-12-22 ENCOUNTER — Other Ambulatory Visit: Payer: Self-pay

## 2023-12-22 DIAGNOSIS — E291 Testicular hypofunction: Secondary | ICD-10-CM

## 2023-12-22 DIAGNOSIS — N529 Male erectile dysfunction, unspecified: Secondary | ICD-10-CM

## 2023-12-23 ENCOUNTER — Other Ambulatory Visit: Payer: 59

## 2023-12-23 DIAGNOSIS — N529 Male erectile dysfunction, unspecified: Secondary | ICD-10-CM

## 2023-12-23 DIAGNOSIS — E291 Testicular hypofunction: Secondary | ICD-10-CM

## 2023-12-24 LAB — HEMOGLOBIN AND HEMATOCRIT, BLOOD
Hematocrit: 43.9 % (ref 37.5–51.0)
Hemoglobin: 14 g/dL (ref 13.0–17.7)

## 2023-12-24 LAB — TESTOSTERONE: Testosterone: 729 ng/dL (ref 264–916)

## 2023-12-24 LAB — PSA: Prostate Specific Ag, Serum: 0.7 ng/mL (ref 0.0–4.0)

## 2023-12-30 NOTE — Progress Notes (Unsigned)
  [name] [address] [place] [address] Phone: [phone] Subjective:   John Murray, am serving as a scribe for Dr. Antoine Primas.  I'm seeing this patient by the request  of:  Bethanie Dicker, NP  CC: Back and neck pain follow-up  [id number]  John Murray is a 38 y.o. male coming in with complaint of back and neck pain. OMT 10/21/2023. Patient states that he has not tightness recently.  Describes the pain as a dull, throbbing aching sensation.  Nothing that stopping him from activity though.   Taking: Flexeril as needed did take it on Monday         Reviewed prior external information including notes and imaging from previsou exam, outside providers and external EMR if available.   As well as notes that were available from care everywhere and other healthcare systems.  Past medical history, social, surgical and family history all reviewed in electronic medical record.  No pertanent information unless stated regarding to the chief complaint.   Past Medical History:  Diagnosis Date   Allergy 2002   Seasonal and cats   Eustachian tube dysfunction    History of cellulitis    Hypertension 05-2021   Corrected by medication    No Known Allergies   Review of Systems:  No headache, visual changes, nausea, vomiting, diarrhea, constipation, dizziness, abdominal pain, skin rash, fevers, chills, night sweats, weight loss, swollen lymph nodes, body aches, joint swelling, chest pain, shortness of breath, mood changes. POSITIVE muscle aches  Objective  Blood pressure 114/86, pulse 72, height 5\' 9"  (1.753 m), weight 221 lb (100.2 kg), SpO2 99%.   General: No apparent distress alert and oriented x3 mood and affect normal, dressed appropriately.  HEENT: Pupils equal, extraocular movements intact  Respiratory: Patient's speak in full sentences and does not appear short of breath  Cardiovascular: No lower extremity edema, non tender, no  erythema  Gait MSK:  Back does have some loss lordosis noted.  Some tenderness to palpation of the paraspinal musculature.  Negative straight leg test.  Does have tightness noted in the parascapular area.  Osteopathic findings  C2 flexed rotated and side bent right T3 extended rotated and side bent right inhaled rib T9 extended rotated and side bent left L2 flexed rotated and side bent right Sacrum right on right       Assessment and Plan:  Low back pain Discussed posture and ergonomics, discussed which activities to do and which ones to avoid.  Discussed home exercises and icing regimen.  Increase activity slowly.  Discussed icing regimen.  Continue to work on core strength which patient has done relatively well.  Follow-up again in 6 to 8 weeks otherwise.    Nonallopathic problems  Decision today to treat with OMT was based on Physical Exam  After verbal consent patient was treated with HVLA, ME, FPR techniques in cervical, rib, thoracic, lumbar, and sacral  areas  Patient tolerated the procedure well with improvement in symptoms  Patient given exercises, stretches and lifestyle modifications  See medications in patient instructions if given  Patient will follow up in 4-8 weeks     The above documentation has been reviewed and is accurate and complete Judi Saa, DO         Note: This dictation was prepared with Dragon dictation along with smaller phrase technology. Any transcriptional errors that result from this process are unintentional.

## 2024-01-01 ENCOUNTER — Ambulatory Visit: Payer: 59 | Admitting: Family Medicine

## 2024-01-01 ENCOUNTER — Encounter: Payer: Self-pay | Admitting: Family Medicine

## 2024-01-01 VITALS — BP 114/86 | HR 72 | Ht 69.0 in | Wt 221.0 lb

## 2024-01-01 DIAGNOSIS — M9903 Segmental and somatic dysfunction of lumbar region: Secondary | ICD-10-CM | POA: Diagnosis not present

## 2024-01-01 DIAGNOSIS — M9902 Segmental and somatic dysfunction of thoracic region: Secondary | ICD-10-CM

## 2024-01-01 DIAGNOSIS — M545 Low back pain, unspecified: Secondary | ICD-10-CM | POA: Diagnosis not present

## 2024-01-01 DIAGNOSIS — M9904 Segmental and somatic dysfunction of sacral region: Secondary | ICD-10-CM

## 2024-01-01 DIAGNOSIS — G8929 Other chronic pain: Secondary | ICD-10-CM

## 2024-01-01 DIAGNOSIS — M9901 Segmental and somatic dysfunction of cervical region: Secondary | ICD-10-CM

## 2024-01-01 DIAGNOSIS — M9908 Segmental and somatic dysfunction of rib cage: Secondary | ICD-10-CM | POA: Diagnosis not present

## 2024-01-01 NOTE — Assessment & Plan Note (Signed)
 Discussed posture and ergonomics, discussed which activities to do and which ones to avoid.  Discussed home exercises and icing regimen.  Increase activity slowly.  Discussed icing regimen.  Continue to work on core strength which patient has done relatively well.  Follow-up again in 6 to 8 weeks otherwise.

## 2024-01-20 ENCOUNTER — Other Ambulatory Visit: Payer: Self-pay | Admitting: Physician Assistant

## 2024-01-20 ENCOUNTER — Other Ambulatory Visit: Payer: Self-pay | Admitting: Family Medicine

## 2024-01-20 DIAGNOSIS — E291 Testicular hypofunction: Secondary | ICD-10-CM

## 2024-01-20 MED ORDER — TESTOSTERONE CYPIONATE 200 MG/ML IM SOLN: INTRAMUSCULAR | 0 refills | Status: DC

## 2024-01-21 MED ORDER — TIZANIDINE HCL 4 MG PO TABS: 4.0000 mg | ORAL_TABLET | Freq: Every day | ORAL | 0 refills | Status: AC

## 2024-03-02 NOTE — Progress Notes (Unsigned)
 [facility] [address] [place] [address] Phone: [phone] Subjective:   John Murray, am serving as a scribe for Dr. Ronnell Murray.  I'm seeing this patient by the request  of:  John Burkitt, NP  CC: Low back pain.  [id number]  John Murray is a 38 y.o. male coming in with complaint of back and neck pain. OMT 01/01/2024.Patient states that he is the same as last visit. No changes.   Medications patient has been prescribed: Zanaflex   Taking:         Reviewed prior external information including notes and imaging from previsou exam, outside providers and external EMR if available.   As well as notes that were available from care everywhere and other healthcare systems.  Past medical history, social, surgical and family history all reviewed in electronic medical record.  No pertanent information unless stated regarding to the chief complaint.   Past Medical History:  Diagnosis Date   Allergy 2002   Seasonal and cats   Eustachian tube dysfunction    History of cellulitis    Hypertension 05-2021   Corrected by medication    No Known Allergies   Review of Systems:  No  visual changes, nausea, vomiting, diarrhea, constipation, dizziness, abdominal pain, skin rash, fevers, chills, night sweats, weight loss, swollen lymph nodes, body aches, joint swelling, chest pain, shortness of breath, mood changes. POSITIVE muscle aches, headache and lots of congestion  Objective  Blood pressure (!) 130/90, pulse 72, height 5\' 9"  (1.753 m), weight 220 lb (99.8 kg), SpO2 98%.   General: No apparent distress alert and oriented x3 mood and affect normal, dressed appropriately.  Fullness of the maxillary sinuses noted with the tenderness to percussion HEENT: Pupils equal, extraocular movements intact  Respiratory: Patient's speak in full sentences and does not appear short of breath  Cardiovascular: No lower extremity edema, non tender, no  erythema  Gait MSK:  Back low back does have some loss lordosis.  Some tenderness to palpation in the paraspinal musculature.  Tightness with Veldon German right greater than left.  Osteopathic findings  C2 flexed rotated and side bent right C6 flexed rotated and side bent left T3 extended rotated and side bent right inhaled rib T9 extended rotated and side bent left L2 flexed rotated and side bent right Sacrum right on right       Assessment and Plan:  Low back pain Has been doing relatively well overall.  Some mild tightness secondary to some repetitive lifting recently.  Discussed icing regimen of home exercises, discussed continuing on core strengthening.  Patient will be traveling and this can cause some discomfort from time to time.  Follow-up again in 6 to 8 weeks.  Sinusitis Patient has what appears to be more of a sinusitis.  Doxycycline given with patient is traveling in the near future.  Has been going on greater than 2 weeks he states.  No fever likely.  Worsening symptoms to seek medical attention.    Nonallopathic problems  Decision today to treat with OMT was based on Physical Exam  After verbal consent patient was treated with HVLA, ME, FPR techniques in cervical, rib, thoracic, lumbar, and sacral  areas  Patient tolerated the procedure well with improvement in symptoms  Patient given exercises, stretches and lifestyle modifications  See medications in patient instructions if given  Patient will follow up in 4-8 weeks     The above documentation has been reviewed and is accurate and  complete John Coles M Tyray Proch, DO         Note: This dictation was prepared with Dragon dictation along with smaller phrase technology. Any transcriptional errors that result from this process are unintentional.

## 2024-03-04 ENCOUNTER — Ambulatory Visit: Admitting: Family Medicine

## 2024-03-04 ENCOUNTER — Encounter: Payer: Self-pay | Admitting: Family Medicine

## 2024-03-04 VITALS — BP 130/90 | HR 72 | Ht 69.0 in | Wt 220.0 lb

## 2024-03-04 DIAGNOSIS — M9904 Segmental and somatic dysfunction of sacral region: Secondary | ICD-10-CM

## 2024-03-04 DIAGNOSIS — M9903 Segmental and somatic dysfunction of lumbar region: Secondary | ICD-10-CM | POA: Diagnosis not present

## 2024-03-04 DIAGNOSIS — J329 Chronic sinusitis, unspecified: Secondary | ICD-10-CM | POA: Insufficient documentation

## 2024-03-04 DIAGNOSIS — M545 Low back pain, unspecified: Secondary | ICD-10-CM

## 2024-03-04 DIAGNOSIS — M9901 Segmental and somatic dysfunction of cervical region: Secondary | ICD-10-CM

## 2024-03-04 DIAGNOSIS — J0100 Acute maxillary sinusitis, unspecified: Secondary | ICD-10-CM | POA: Diagnosis not present

## 2024-03-04 DIAGNOSIS — M9902 Segmental and somatic dysfunction of thoracic region: Secondary | ICD-10-CM

## 2024-03-04 DIAGNOSIS — M9908 Segmental and somatic dysfunction of rib cage: Secondary | ICD-10-CM

## 2024-03-04 MED ORDER — DOXYCYCLINE HYCLATE 100 MG PO TABS: 100.0000 mg | ORAL_TABLET | Freq: Two times a day (BID) | ORAL | 0 refills | Status: AC

## 2024-03-04 NOTE — Patient Instructions (Signed)
 Great to see you Have a great trip Doxy 2x a day for 10 days See me again in 7-8 weeks

## 2024-03-04 NOTE — Assessment & Plan Note (Signed)
 Has been doing relatively well overall.  Some mild tightness secondary to some repetitive lifting recently.  Discussed icing regimen of home exercises, discussed continuing on core strengthening.  Patient will be traveling and this can cause some discomfort from time to time.  Follow-up again in 6 to 8 weeks.

## 2024-03-04 NOTE — Assessment & Plan Note (Signed)
 Patient has what appears to be more of a sinusitis.  Doxycycline given with patient is traveling in the near future.  Has been going on greater than 2 weeks he states.  No fever likely.  Worsening symptoms to seek medical attention.

## 2024-03-20 ENCOUNTER — Other Ambulatory Visit: Payer: Self-pay | Admitting: Physician Assistant

## 2024-03-20 DIAGNOSIS — E291 Testicular hypofunction: Secondary | ICD-10-CM

## 2024-03-30 ENCOUNTER — Telehealth: Payer: Self-pay | Admitting: Nurse Practitioner

## 2024-03-30 ENCOUNTER — Other Ambulatory Visit: Payer: Self-pay | Admitting: *Deleted

## 2024-03-30 DIAGNOSIS — E291 Testicular hypofunction: Secondary | ICD-10-CM

## 2024-03-30 NOTE — Telephone Encounter (Signed)
 Patient need labs ordered

## 2024-03-31 ENCOUNTER — Other Ambulatory Visit

## 2024-03-31 DIAGNOSIS — E291 Testicular hypofunction: Secondary | ICD-10-CM

## 2024-04-01 ENCOUNTER — Ambulatory Visit: Payer: Self-pay | Admitting: Urology

## 2024-04-01 ENCOUNTER — Other Ambulatory Visit: Payer: Self-pay | Admitting: Nurse Practitioner

## 2024-04-01 DIAGNOSIS — E669 Obesity, unspecified: Secondary | ICD-10-CM

## 2024-04-01 DIAGNOSIS — E782 Mixed hyperlipidemia: Secondary | ICD-10-CM

## 2024-04-01 DIAGNOSIS — Z1329 Encounter for screening for other suspected endocrine disorder: Secondary | ICD-10-CM

## 2024-04-01 DIAGNOSIS — I10 Essential (primary) hypertension: Secondary | ICD-10-CM

## 2024-04-01 LAB — TESTOSTERONE: Testosterone: 834 ng/dL (ref 264–916)

## 2024-04-01 LAB — HEMOGLOBIN AND HEMATOCRIT, BLOOD
Hematocrit: 44.1 % (ref 37.5–51.0)
Hemoglobin: 14.2 g/dL (ref 13.0–17.7)

## 2024-04-13 ENCOUNTER — Other Ambulatory Visit: Payer: 59

## 2024-04-20 NOTE — Progress Notes (Unsigned)
  [facility] [address] [place] [address] Phone: [phone] Subjective:   ISusannah Gully, am serving as a scribe for Dr. Arthea Claudene.  I'm seeing this patient by the request  of:  Gretel App, NP  CC: Low back pain  [name]  QUINNTIN MALTER is a 38 y.o. male coming in with complaint of back and neck pain. OMT 03/04/2024. Patient states doing okay. No new symptoms.  Medications patient has been prescribed: Doxy, Zanaflex   Taking:         Reviewed prior external information including notes and imaging from previsou exam, outside providers and external EMR if available.   As well as notes that were available from care everywhere and other healthcare systems.  Past medical history, social, surgical and family history all reviewed in electronic medical record.  No pertanent information unless stated regarding to the chief complaint.   Past Medical History:  Diagnosis Date   Allergy 2002   Seasonal and cats   Eustachian tube dysfunction    History of cellulitis    Hypertension 05-2021   Corrected by medication    No Known Allergies   Review of Systems:  No headache, visual changes, nausea, vomiting, diarrhea, constipation, dizziness, abdominal pain, skin rash, fevers, chills, night sweats, weight loss, swollen lymph nodes, body aches, joint swelling, chest pain, shortness of breath, mood changes. POSITIVE muscle aches  Objective  Blood pressure 120/78, pulse 79, height 5' 9 (1.753 m), weight 221 lb (100.2 kg), SpO2 98%.   General: No apparent distress alert and oriented x3 mood and affect normal, dressed appropriately.  HEENT: Pupils equal, extraocular movements intact  Respiratory: Patient's speak in full sentences and does not appear short of breath  Cardiovascular: No lower extremity edema, non tender, no erythema  Gait MSK:  Back L back does have some loss lordosis.  Some tenderness to palpation noted.  Tightness  noted in the thoracolumbar juncture right greater than left. Right sacroiliac joint  Osteopathic findings  C3 flexed rotated and side bent right C6 flexed rotated and side bent left T3 extended rotated and side bent right inhaled rib L1 flexed rotated and side bent right Sacrum right on right       Assessment and Plan:  Low back pain Continues to have some low back pain.  Further evaluation today.  Discussed with the home exercises, icing regimen, which activities to do in which ones to avoid.  After further evaluation osteopathic manipulation done today as well.  Patient no longer having any type of radicular symptoms.  Not stopping him from activity.  No med changes.  Follow-up again in 6 to 8 weeks    Nonallopathic problems  Decision today to treat with OMT was based on Physical Exam  After verbal consent patient was treated with HVLA, ME, FPR techniques in cervical, rib, thoracic, lumbar, and sacral  areas  Patient tolerated the procedure well with improvement in symptoms  Patient given exercises, stretches and lifestyle modifications  See medications in patient instructions if given  Patient will follow up in 4-8 weeks     The above documentation has been reviewed and is accurate and complete Johnanthony Wilden M Terrye Dombrosky, DO         Note: This dictation was prepared with Dragon dictation along with smaller phrase technology. Any transcriptional errors that result from this process are unintentional.

## 2024-04-21 ENCOUNTER — Ambulatory Visit: Admitting: Family Medicine

## 2024-04-21 ENCOUNTER — Encounter: Payer: Self-pay | Admitting: Family Medicine

## 2024-04-21 VITALS — BP 120/78 | HR 79 | Ht 69.0 in | Wt 221.0 lb

## 2024-04-21 DIAGNOSIS — M9901 Segmental and somatic dysfunction of cervical region: Secondary | ICD-10-CM

## 2024-04-21 DIAGNOSIS — G8929 Other chronic pain: Secondary | ICD-10-CM

## 2024-04-21 DIAGNOSIS — M9904 Segmental and somatic dysfunction of sacral region: Secondary | ICD-10-CM

## 2024-04-21 DIAGNOSIS — M9908 Segmental and somatic dysfunction of rib cage: Secondary | ICD-10-CM | POA: Diagnosis not present

## 2024-04-21 DIAGNOSIS — M9902 Segmental and somatic dysfunction of thoracic region: Secondary | ICD-10-CM | POA: Diagnosis not present

## 2024-04-21 DIAGNOSIS — M545 Low back pain, unspecified: Secondary | ICD-10-CM | POA: Diagnosis not present

## 2024-04-21 DIAGNOSIS — M9903 Segmental and somatic dysfunction of lumbar region: Secondary | ICD-10-CM

## 2024-04-21 NOTE — Assessment & Plan Note (Signed)
 Continues to have some low back pain.  Further evaluation today.  Discussed with the home exercises, icing regimen, which activities to do in which ones to avoid.  After further evaluation osteopathic manipulation done today as well.  Patient no longer having any type of radicular symptoms.  Not stopping him from activity.  No med changes.  Follow-up again in 6 to 8 weeks

## 2024-04-22 ENCOUNTER — Encounter: Payer: 59 | Admitting: Nurse Practitioner

## 2024-05-12 ENCOUNTER — Other Ambulatory Visit (HOSPITAL_BASED_OUTPATIENT_CLINIC_OR_DEPARTMENT_OTHER): Payer: Self-pay | Admitting: Family Medicine

## 2024-05-12 DIAGNOSIS — Z8249 Family history of ischemic heart disease and other diseases of the circulatory system: Secondary | ICD-10-CM

## 2024-05-15 ENCOUNTER — Ambulatory Visit (HOSPITAL_BASED_OUTPATIENT_CLINIC_OR_DEPARTMENT_OTHER)
Admission: RE | Admit: 2024-05-15 | Discharge: 2024-05-15 | Disposition: A | Discharge status: Home / Self Care | Payer: Self-pay | Source: Ambulatory Visit | Attending: Family Medicine | Admitting: Family Medicine

## 2024-05-15 DIAGNOSIS — Z8249 Family history of ischemic heart disease and other diseases of the circulatory system: Secondary | ICD-10-CM

## 2024-05-16 ENCOUNTER — Other Ambulatory Visit: Payer: Self-pay | Admitting: Nurse Practitioner

## 2024-05-16 DIAGNOSIS — I10 Essential (primary) hypertension: Secondary | ICD-10-CM

## 2024-06-02 ENCOUNTER — Other Ambulatory Visit: Payer: Self-pay

## 2024-06-02 DIAGNOSIS — N529 Male erectile dysfunction, unspecified: Secondary | ICD-10-CM

## 2024-06-02 DIAGNOSIS — E291 Testicular hypofunction: Secondary | ICD-10-CM

## 2024-06-03 ENCOUNTER — Other Ambulatory Visit

## 2024-06-03 DIAGNOSIS — N529 Male erectile dysfunction, unspecified: Secondary | ICD-10-CM

## 2024-06-03 DIAGNOSIS — E291 Testicular hypofunction: Secondary | ICD-10-CM

## 2024-06-04 LAB — TESTOSTERONE: Testosterone: 774 ng/dL (ref 264–916)

## 2024-06-08 ENCOUNTER — Other Ambulatory Visit (INDEPENDENT_AMBULATORY_CARE_PROVIDER_SITE_OTHER)

## 2024-06-08 DIAGNOSIS — Z1329 Encounter for screening for other suspected endocrine disorder: Secondary | ICD-10-CM

## 2024-06-08 DIAGNOSIS — E782 Mixed hyperlipidemia: Secondary | ICD-10-CM

## 2024-06-08 DIAGNOSIS — I10 Essential (primary) hypertension: Secondary | ICD-10-CM | POA: Diagnosis not present

## 2024-06-08 DIAGNOSIS — E669 Obesity, unspecified: Secondary | ICD-10-CM

## 2024-06-08 LAB — CBC WITH DIFFERENTIAL/PLATELET
Basophils Absolute: 0 K/uL (ref 0.0–0.1)
Basophils Relative: 0.6 % (ref 0.0–3.0)
Eosinophils Absolute: 0.1 K/uL (ref 0.0–0.7)
Eosinophils Relative: 2.4 % (ref 0.0–5.0)
HCT: 40.7 % (ref 39.0–52.0)
Hemoglobin: 13.4 g/dL (ref 13.0–17.0)
Lymphocytes Relative: 35.5 % (ref 12.0–46.0)
Lymphs Abs: 2.1 K/uL (ref 0.7–4.0)
MCHC: 32.9 g/dL (ref 30.0–36.0)
MCV: 74.1 fl — ABNORMAL LOW (ref 78.0–100.0)
Monocytes Absolute: 0.4 K/uL (ref 0.1–1.0)
Monocytes Relative: 7.6 % (ref 3.0–12.0)
Neutro Abs: 3.1 K/uL (ref 1.4–7.7)
Neutrophils Relative %: 53.9 % (ref 43.0–77.0)
Platelets: 295 K/uL (ref 150.0–400.0)
RBC: 5.5 Mil/uL (ref 4.22–5.81)
RDW: 15.9 % — ABNORMAL HIGH (ref 11.5–15.5)
WBC: 5.8 K/uL (ref 4.0–10.5)

## 2024-06-08 LAB — COMPREHENSIVE METABOLIC PANEL WITH GFR
ALT: 23 U/L (ref 0–53)
AST: 21 U/L (ref 0–37)
Albumin: 4.3 g/dL (ref 3.5–5.2)
Alkaline Phosphatase: 51 U/L (ref 39–117)
BUN: 17 mg/dL (ref 6–23)
CO2: 28 meq/L (ref 19–32)
Calcium: 8.9 mg/dL (ref 8.4–10.5)
Chloride: 100 meq/L (ref 96–112)
Creatinine, Ser: 1.12 mg/dL (ref 0.40–1.50)
GFR: 83 mL/min (ref >60.00)
Glucose, Bld: 98 mg/dL (ref 70–99)
Potassium: 4.1 meq/L (ref 3.5–5.1)
Sodium: 139 meq/L (ref 135–145)
Total Bilirubin: 0.6 mg/dL (ref 0.2–1.2)
Total Protein: 6.9 g/dL (ref 6.0–8.3)

## 2024-06-08 LAB — LIPID PANEL
Cholesterol: 198 mg/dL (ref 0–200)
HDL: 35.5 mg/dL — ABNORMAL LOW (ref >39.00)
LDL Cholesterol: 127 mg/dL — ABNORMAL HIGH (ref 0–99)
NonHDL: 162.88
Total CHOL/HDL Ratio: 6
Triglycerides: 179 mg/dL — ABNORMAL HIGH (ref 0.0–149.0)
VLDL: 35.8 mg/dL (ref 0.0–40.0)

## 2024-06-08 LAB — HEMOGLOBIN A1C: Hgb A1c MFr Bld: 6.2 % (ref 4.6–6.5)

## 2024-06-08 LAB — TSH: TSH: 0.54 u[IU]/mL (ref 0.35–5.50)

## 2024-06-09 NOTE — Progress Notes (Unsigned)
 06/10/24 9:27 AM   John Murray October 12, 1985 [id number]  Referring provider:  Gretel App, NP [address]  [place] [address]  Urological History: 1. Hypogonadism -contributing factors of high BMI -testosterone  level (06/2024) 774 -Hemoglobin/hematocrit (06/2024) 13.4/40.7 -testosterone  cypionate 200mg /mL, 0.5 cc every 7 days   2. Erectile dysfunction:  -Contributing factors of testosterone  deficiency, hypertension, alcohol consumption and hyperlipidemia  Other   HPI: John Murray is a 38 y.o.man who presents today for yearly follow up.    Previous records reviewed.     He denies any issues with urination.  Patient denies any modifying or aggravating factors.  Patient denies any recent UTI's, gross hematuria, dysuria or suprapubic/flank pain.  Patient denies any fevers, chills, nausea or vomiting.    He is experiencing some mild ED.   Patient still having spontaneous erections.    He denies any pain or curvature with erections.  He does not want to start a daily PDE5 inhibitor or an on demand dosing of PDE5 inhibitor.    PMH: Past Medical History:  Diagnosis Date   Allergy 2002   Seasonal and cats   Eustachian tube dysfunction    History of cellulitis    Hypertension 05-2021   Corrected by medication    Surgical History: Past Surgical History:  Procedure Laterality Date   WISDOM TOOTH EXTRACTION     2008    Home Medications:  Allergies as of 06/10/2024   No Known Allergies      Medication List        Accurate as of June 10, 2024  9:27 AM. If you have any questions, ask your nurse or doctor.          2-3CC SYRINGE 3 ML Misc 1 mg by Does not apply route every 14 (fourteen) days. 0.75 cc every 7 days   BD Disp Needles 18G X 1-1/2 Misc Generic drug: NEEDLE (DISP) 18 G 1 mg by Does not apply route every 14 (fourteen) days.   BD Disp Needles 21G X 1-1/2 Misc Generic drug: NEEDLE (DISP) 21 G 1 mg by Does not apply route  every 14 (fourteen) days.   doxycycline  100 MG tablet Commonly known as: VIBRA -TABS Take 1 tablet (100 mg total) by mouth 2 (two) times daily.   hydrochlorothiazide  25 MG tablet Commonly known as: HYDRODIURIL  Take 1 tablet (25 mg total) by mouth daily.   lisinopril  40 MG tablet Commonly known as: ZESTRIL  TAKE 1 TABLET BY MOUTH EVERY DAY   meloxicam  15 MG tablet Commonly known as: MOBIC  TAKE 1 TABLET (15 MG TOTAL) BY MOUTH DAILY. What changed: additional instructions   MULTIVITAMIN PO Take by mouth.   nystatin -triamcinolone  ointment Commonly known as: MYCOLOG Apply 1 Application topically 2 (two) times daily.   testosterone  cypionate 200 MG/ML injection Commonly known as: DEPOTESTOSTERONE CYPIONATE INJECT 0.75 ML EVERY 7 DAYS INTO MUSCLE   tiZANidine  4 MG tablet Commonly known as: ZANAFLEX  Take 1 tablet (4 mg total) by mouth at bedtime.        Allergies: No Known Allergies  Family History: Family History  Problem Relation Age of Onset   Hypertension Mother    Prostate cancer Father    Cancer Father    Healthy Son    Heart disease Maternal Grandfather    Hypertension Maternal Grandfather    Heart disease Maternal Grandmother     Social History:  reports that he has quit smoking. His smokeless tobacco use includes chew. He reports current alcohol  use of about 14.0 standard drinks of alcohol per week. He reports that he does not use drugs.   Physical Exam: BP (!) 144/84   Pulse 65   Ht 5' 9 (1.753 m)   Wt 220 lb (99.8 kg)   SpO2 98%   BMI 32.49 kg/m   Constitutional:  Well nourished. Alert and oriented, No acute distress. HEENT: Platter AT, moist mucus membranes.  Trachea midline Cardiovascular: No clubbing, cyanosis, or edema. Respiratory: Normal respiratory effort, no increased work of breathing. Neurologic: Grossly intact, no focal deficits, moving all 4 extremities. Psychiatric: Normal mood and affect.   Laboratory Data:    Latest Ref Rng & Units  06/08/2024    7:33 AM 04/16/2023    2:28 PM 04/17/2022    8:12 AM  CMP  Glucose 70 - 99 mg/dL 98  91  92   BUN 6 - 23 mg/dL 17  15  15    Creatinine 0.40 - 1.50 mg/dL 8.87  8.75  8.93   Sodium 135 - 145 mEq/L 139  140  141   Potassium 3.5 - 5.1 mEq/L 4.1  4.3  4.6   Chloride 96 - 112 mEq/L 100  101  105   CO2 19 - 32 mEq/L 28  25  20    Calcium 8.4 - 10.5 mg/dL 8.9  9.7  9.5   Total Protein 6.0 - 8.3 g/dL 6.9  7.0  6.6   Total Bilirubin 0.2 - 1.2 mg/dL 0.6  1.2  0.9   Alkaline Phos 39 - 117 U/L 51  60  57   AST 0 - 37 U/L 21  28  20    ALT 0 - 53 U/L 23  28  24         Latest Ref Rng & Units 06/08/2024    7:33 AM 03/31/2024    8:10 AM 12/23/2023    8:45 AM  CBC  WBC 4.0 - 10.5 K/uL 5.8     Hemoglobin 13.0 - 17.0 g/dL 86.5  85.7  85.9   Hematocrit 39.0 - 52.0 % 40.7  44.1  43.9   Platelets 150.0 - 400.0 K/uL 295.0       Results for orders placed or performed in visit on 06/08/24  Hemoglobin A1c   Collection Time: 06/08/24  7:33 AM  Result Value Ref Range   Hgb A1c MFr Bld 6.2 4.6 - 6.5 %  TSH   Collection Time: 06/08/24  7:33 AM  Result Value Ref Range   TSH 0.54 0.35 - 5.50 uIU/mL  Lipid panel   Collection Time: 06/08/24  7:33 AM  Result Value Ref Range   Cholesterol 198 0 - 200 mg/dL   Triglycerides 820.9 (H) 0.0 - 149.0 mg/dL   HDL 64.49 (L) >60.99 mg/dL   VLDL 64.1 0.0 - 59.9 mg/dL   LDL Cholesterol 872 (H) 0 - 99 mg/dL   Total CHOL/HDL Ratio 6    NonHDL 162.88   Comprehensive metabolic panel with GFR   Collection Time: 06/08/24  7:33 AM  Result Value Ref Range   Sodium 139 135 - 145 mEq/L   Potassium 4.1 3.5 - 5.1 mEq/L   Chloride 100 96 - 112 mEq/L   CO2 28 19 - 32 mEq/L   Glucose, Bld 98 70 - 99 mg/dL   BUN 17 6 - 23 mg/dL   Creatinine, Ser 8.87 0.40 - 1.50 mg/dL   Total Bilirubin 0.6 0.2 - 1.2 mg/dL   Alkaline Phosphatase 51 39 - 117 U/L  AST 21 0 - 37 U/L   ALT 23 0 - 53 U/L   Total Protein 6.9 6.0 - 8.3 g/dL   Albumin 4.3 3.5 - 5.2 g/dL   GFR 16.99  >39.99 mL/min   Calcium 8.9 8.4 - 10.5 mg/dL  CBC with Differential/Platelet   Collection Time: 06/08/24  7:33 AM  Result Value Ref Range   WBC 5.8 4.0 - 10.5 K/uL   RBC 5.50 4.22 - 5.81 Mil/uL   Hemoglobin 13.4 13.0 - 17.0 g/dL   HCT 59.2 60.9 - 47.9 %   MCV 74.1 (L) 78.0 - 100.0 fl   MCHC 32.9 30.0 - 36.0 g/dL   RDW 84.0 (H) 88.4 - 84.4 %   Platelets 295.0 150.0 - 400.0 K/uL   Neutrophils Relative % 53.9 43.0 - 77.0 %   Lymphocytes Relative 35.5 12.0 - 46.0 %   Monocytes Relative 7.6 3.0 - 12.0 %   Eosinophils Relative 2.4 0.0 - 5.0 %   Basophils Relative 0.6 0.0 - 3.0 %   Neutro Abs 3.1 1.4 - 7.7 K/uL   Lymphs Abs 2.1 0.7 - 4.0 K/uL   Monocytes Absolute 0.4 0.1 - 1.0 K/uL   Eosinophils Absolute 0.1 0.0 - 0.7 K/uL   Basophils Absolute 0.0 0.0 - 0.1 K/uL   I have reviewed the labs.  See HPI.    Pertinent Imaging N/A    Assessment & Plan:    1. Hypogonadism -testosterone  levels are therapeutic -H & H WNL,  but getting progressively lower, so we will switch from donating blood every 8 weeks to every 16 weeks -Continue testosterone  cypionate 200 mg/milliliters, 0.75 cc every 7 days, refills sent to pharmacy   2. Erectile dysfunction:    -Testosterone  level is therapeutic at this time -mild ed, not to the point where he would want to try PDE 5 inhibitor at this time    Return in about 6 months (around 12/08/2024) for Testosterone , hemoglobin and hematocrit.   John Murray   [facility] [address], [address] [place], [place] [address] [phone]

## 2024-06-10 ENCOUNTER — Encounter: Payer: Self-pay | Admitting: Urology

## 2024-06-10 ENCOUNTER — Ambulatory Visit: Admitting: Urology

## 2024-06-10 VITALS — BP 144/84 | HR 65 | Ht 69.0 in | Wt 220.0 lb

## 2024-06-10 DIAGNOSIS — E291 Testicular hypofunction: Secondary | ICD-10-CM | POA: Diagnosis not present

## 2024-06-10 DIAGNOSIS — N529 Male erectile dysfunction, unspecified: Secondary | ICD-10-CM | POA: Diagnosis not present

## 2024-06-10 MED ORDER — TESTOSTERONE CYPIONATE 200 MG/ML IM SOLN: INTRAMUSCULAR | 2 refills | Status: AC

## 2024-06-10 NOTE — Addendum Note (Signed)
 Addended by: DEBBY LAYMON HERO on: 06/10/2024 09:29 AM   Modules accepted: Orders

## 2024-06-15 ENCOUNTER — Encounter: Admitting: Nurse Practitioner

## 2024-06-18 ENCOUNTER — Ambulatory Visit: Payer: Self-pay | Admitting: Nurse Practitioner

## 2024-06-18 ENCOUNTER — Other Ambulatory Visit: Payer: Self-pay | Admitting: Nurse Practitioner

## 2024-06-18 DIAGNOSIS — R718 Other abnormality of red blood cells: Secondary | ICD-10-CM

## 2024-06-21 ENCOUNTER — Other Ambulatory Visit (INDEPENDENT_AMBULATORY_CARE_PROVIDER_SITE_OTHER)

## 2024-06-21 DIAGNOSIS — R718 Other abnormality of red blood cells: Secondary | ICD-10-CM | POA: Diagnosis not present

## 2024-06-21 LAB — IBC + FERRITIN
Ferritin: 6.2 ng/mL — ABNORMAL LOW (ref 22.0–322.0)
Iron: 19 ug/dL — ABNORMAL LOW (ref 42–165)
Saturation Ratios: 3.8 % — ABNORMAL LOW (ref 20.0–50.0)
TIBC: 502.6 ug/dL — ABNORMAL HIGH (ref 250.0–450.0)
Transferrin: 359 mg/dL (ref 212.0–360.0)

## 2024-06-21 LAB — CBC WITH DIFFERENTIAL/PLATELET
Basophils Absolute: 0 K/uL (ref 0.0–0.1)
Basophils Relative: 0.7 % (ref 0.0–3.0)
Eosinophils Absolute: 0.1 K/uL (ref 0.0–0.7)
Eosinophils Relative: 1.7 % (ref 0.0–5.0)
HCT: 43.5 % (ref 39.0–52.0)
Hemoglobin: 14 g/dL (ref 13.0–17.0)
Lymphocytes Relative: 24.8 % (ref 12.0–46.0)
Lymphs Abs: 1.6 K/uL (ref 0.7–4.0)
MCHC: 32.2 g/dL (ref 30.0–36.0)
MCV: 74.3 fl — ABNORMAL LOW (ref 78.0–100.0)
Monocytes Absolute: 0.4 K/uL (ref 0.1–1.0)
Monocytes Relative: 7.2 % (ref 3.0–12.0)
Neutro Abs: 4.1 K/uL (ref 1.4–7.7)
Neutrophils Relative %: 65.6 % (ref 43.0–77.0)
Platelets: 293 K/uL (ref 150.0–400.0)
RBC: 5.86 Mil/uL — ABNORMAL HIGH (ref 4.22–5.81)
RDW: 15.4 % (ref 11.5–15.5)
WBC: 6.3 K/uL (ref 4.0–10.5)

## 2024-06-24 ENCOUNTER — Ambulatory Visit: Payer: Self-pay | Admitting: Nurse Practitioner

## 2024-06-29 ENCOUNTER — Ambulatory Visit: Admitting: Family Medicine

## 2024-07-14 ENCOUNTER — Encounter: Admitting: Nurse Practitioner

## 2024-07-19 ENCOUNTER — Other Ambulatory Visit: Payer: Self-pay | Admitting: Nurse Practitioner

## 2024-07-19 DIAGNOSIS — I10 Essential (primary) hypertension: Secondary | ICD-10-CM

## 2024-07-26 ENCOUNTER — Ambulatory Visit: Admitting: Internal Medicine

## 2024-07-26 ENCOUNTER — Encounter: Payer: Self-pay | Admitting: Internal Medicine

## 2024-07-26 VITALS — BP 124/76 | HR 73 | Temp 98.5°F | Ht 69.0 in | Wt 223.4 lb

## 2024-07-26 DIAGNOSIS — E611 Iron deficiency: Secondary | ICD-10-CM

## 2024-07-26 DIAGNOSIS — Z23 Encounter for immunization: Secondary | ICD-10-CM | POA: Diagnosis not present

## 2024-07-26 DIAGNOSIS — E782 Mixed hyperlipidemia: Secondary | ICD-10-CM

## 2024-07-26 DIAGNOSIS — G4700 Insomnia, unspecified: Secondary | ICD-10-CM

## 2024-07-26 DIAGNOSIS — I10 Essential (primary) hypertension: Secondary | ICD-10-CM | POA: Diagnosis not present

## 2024-07-26 DIAGNOSIS — E291 Testicular hypofunction: Secondary | ICD-10-CM

## 2024-07-26 DIAGNOSIS — Z0000 Encounter for general adult medical examination without abnormal findings: Secondary | ICD-10-CM

## 2024-07-26 DIAGNOSIS — R7303 Prediabetes: Secondary | ICD-10-CM

## 2024-07-26 LAB — CBC WITH DIFFERENTIAL/PLATELET
Basophils Absolute: 0 K/uL (ref 0.0–0.1)
Basophils Relative: 0.4 % (ref 0.0–3.0)
Eosinophils Absolute: 0.2 K/uL (ref 0.0–0.7)
Eosinophils Relative: 2.2 % (ref 0.0–5.0)
HCT: 47.3 % (ref 39.0–52.0)
Hemoglobin: 15.6 g/dL (ref 13.0–17.0)
Lymphocytes Relative: 14.6 % (ref 12.0–46.0)
Lymphs Abs: 1.1 K/uL (ref 0.7–4.0)
MCHC: 32.9 g/dL (ref 30.0–36.0)
MCV: 76.7 fl — ABNORMAL LOW (ref 78.0–100.0)
Monocytes Absolute: 0.6 K/uL (ref 0.1–1.0)
Monocytes Relative: 7.8 % (ref 3.0–12.0)
Neutro Abs: 5.7 K/uL (ref 1.4–7.7)
Neutrophils Relative %: 75 % (ref 43.0–77.0)
Platelets: 239 K/uL (ref 150.0–400.0)
RBC: 6.17 Mil/uL — ABNORMAL HIGH (ref 4.22–5.81)
RDW: 20 % — ABNORMAL HIGH (ref 11.5–15.5)
WBC: 7.7 K/uL (ref 4.0–10.5)

## 2024-07-26 NOTE — Patient Instructions (Addendum)
 VISIT SUMMARY: Today, you had a follow-up appointment to discuss your iron deficiency, blood work monitoring, and other health concerns. We reviewed your recent lab results and discussed your current treatment plans and lifestyle habits.  YOUR PLAN: -IRON DEFICIENCY ANEMIA: Iron deficiency anemia is a condition where your body lacks enough iron to produce healthy red blood cells. You started iron supplements about a month ago. We will recheck your iron levels today and continue the supplements. Additionally, you should adjust your blood donation frequency to once a quarter.  -PREDIABETES: Prediabetes means your blood sugar levels are higher than normal but not high enough to be classified as diabetes. Your recent A1c level increased to 6.2%. Focus on diet and exercise as primary interventions. Aim for a low-carb diet and continue your regular exercise regimen, targeting 75 minutes of vigorous exercise or 115 minutes of moderate exercise weekly.  -HYPERLIPIDEMIA: Hyperlipidemia is a condition where you have high levels of fats (lipids) in your blood. Your recent lab work shows some improvement, but your levels are still slightly elevated, and your triglycerides are high. A low-carb diet is recommended to help manage both hyperlipidemia and prediabetes. Continue with your regular exercise routine.  -ESSENTIAL HYPERTENSION: Essential hypertension is high blood pressure with no identifiable cause. Your blood pressure is well-controlled with your current medications, hydrochlorothiazide  and lisinopril .  -INSOMNIA AND SUSPECTED OBSTRUCTIVE SLEEP APNEA: Insomnia is difficulty in falling or staying asleep, and obstructive sleep apnea is a condition where your breathing stops and starts during sleep. You have difficulty achieving restful sleep, which may be exacerbated by shift work. You also have symptoms suggesting sleep apnea, such as snoring and daytime sleepiness. A sleep study is recommended to evaluate  for sleep apnea, and you were provided with information on sleep hygiene.  INSTRUCTIONS: Please follow up with the recommended sleep study to evaluate for obstructive sleep apnea. Continue with your current medications and lifestyle changes, and recheck your iron levels today. Adjust your blood donation frequency to once a quarter. Maintain your exercise regimen and dietary modifications to manage prediabetes and hyperlipidemia.                      Contains text generated by [employer].                                 Contains text generated by [employer].  Insomnia Insomnia is a sleep disorder that makes it difficult to fall asleep or stay asleep. Insomnia can cause fatigue, low energy, difficulty concentrating, mood swings, and poor performance at work or school. There are three different ways to classify insomnia: Difficulty falling asleep. Difficulty staying asleep. Waking up too early in the morning. Any type of insomnia can be long-term (chronic) or short-term (acute). Both are common. Short-term insomnia usually lasts for 3 months or less. Chronic insomnia occurs at least three times a week for longer than 3 months. What are the causes? Insomnia may be caused by another condition, situation, or substance, such as: Having certain mental health conditions, such as anxiety and depression. Using caffeine, alcohol, tobacco, or drugs. Having gastrointestinal conditions, such as gastroesophageal reflux disease (GERD). Having certain medical conditions. These include: Asthma. Alzheimer's disease. Stroke. Chronic pain. An overactive thyroid gland (hyperthyroidism). Other sleep disorders, such as restless legs syndrome and sleep apnea. Menopause. Sometimes, the cause of insomnia may not be known. What increases the risk? Risk factors for insomnia include: Gender.  Females are affected more often than males. Age. Insomnia is more common as people  get older. Stress and certain medical and mental health conditions. Lack of exercise. Having an irregular work schedule. This may include working night shifts and traveling between different time zones. What are the signs or symptoms? If you have insomnia, the main symptom is having trouble falling asleep or having trouble staying asleep. This may lead to other symptoms, such as: Feeling tired or having low energy. Feeling nervous about going to sleep. Not feeling rested in the morning. Having trouble concentrating. Feeling irritable, anxious, or depressed. How is this diagnosed? This condition may be diagnosed based on: Your symptoms and medical history. Your health care provider may ask about: Your sleep habits. Any medical conditions you have. Your mental health. A physical exam. How is this treated? Treatment for insomnia depends on the cause. Treatment may focus on treating an underlying condition that is causing the insomnia. Treatment may also include: Medicines to help you sleep. Counseling or therapy. Lifestyle adjustments to help you sleep better. Follow these instructions at home: Eating and drinking  Limit or avoid alcohol, caffeinated beverages, and products that contain nicotine and tobacco, especially close to bedtime. These can disrupt your sleep. Do not eat a large meal or eat spicy foods right before bedtime. This can lead to digestive discomfort that can make it hard for you to sleep. Sleep habits  Keep a sleep diary to help you and your health care provider figure out what could be causing your insomnia. Write down: When you sleep. When you wake up during the night. How well you sleep and how rested you feel the next day. Any side effects of medicines you are taking. What you eat and drink. Make your bedroom a dark, comfortable place where it is easy to fall asleep. Put up shades or blackout curtains to block light from outside. Use a white noise machine to  block noise. Keep the temperature cool. Limit screen use before bedtime. This includes: Not watching TV. Not using your smartphone, tablet, or computer. Stick to a routine that includes going to bed and waking up at the same times every day and night. This can help you fall asleep faster. Consider making a quiet activity, such as reading, part of your nighttime routine. Try to avoid taking naps during the day so that you sleep better at night. Get out of bed if you are still awake after 15 minutes of trying to sleep. Keep the lights down, but try reading or doing a quiet activity. When you feel sleepy, go back to bed. General instructions Take over-the-counter and prescription medicines only as told by your health care provider. Exercise regularly as told by your health care provider. However, avoid exercising in the hours right before bedtime. Use relaxation techniques to manage stress. Ask your health care provider to suggest some techniques that may work well for you. These may include: Breathing exercises. Routines to release muscle tension. Visualizing peaceful scenes. Make sure that you drive carefully. Do not drive if you feel very sleepy. Keep all follow-up visits. This is important. Contact a health care provider if: You are tired throughout the day. You have trouble in your daily routine due to sleepiness. You continue to have sleep problems, or your sleep problems get worse. Get help right away if: You have thoughts about hurting yourself or someone else. Get help right away if you feel like you may hurt yourself or others, or have thoughts  about taking your own life. Go to your nearest emergency room or: Call 911. Call the National Suicide Prevention Lifeline at 651 752 5506 or 988. This is open 24 hours a day. Text the Crisis Text Line at (260) 275-9965. Summary Insomnia is a sleep disorder that makes it difficult to fall asleep or stay asleep. Insomnia can be long-term (chronic)  or short-term (acute). Treatment for insomnia depends on the cause. Treatment may focus on treating an underlying condition that is causing the insomnia. Keep a sleep diary to help you and your health care provider figure out what could be causing your insomnia. This information is not intended to replace advice given to you by your health care provider. Make sure you discuss any questions you have with your health care provider. Document Revised: 09/03/2021 Document Reviewed: 09/03/2021 Elsevier Patient Education  2024 [employer].

## 2024-07-26 NOTE — Assessment & Plan Note (Signed)
-   This problem is chronic and stable -Patient is on testosterone  replacement therapy with urology and follows up with them every 3 months -He will continue to follow-up with them for replacement -He gets his testosterone  and CBC monitored regularly with urology -No further workup at this time

## 2024-07-26 NOTE — Assessment & Plan Note (Signed)
-   Patient here for his annual physical exam -Flu vaccine given today -Not due for any cancer screenings at this time

## 2024-07-26 NOTE — Assessment & Plan Note (Signed)
-   This problem is chronic and stable -Patient did have a lipid panel done last month which showed an elevated LDL of 127, low HDL of 35 as well as elevated triglycerides in the 170s -We discussed diet and exercise in detail -Will need to recheck his lipid panel in 6 months to assess for improvement -No further workup at this time

## 2024-07-26 NOTE — Assessment & Plan Note (Signed)
-   Patient was diagnosed with iron deficiency last month and started on iron supplements by his PCP -Will recheck a CBC and iron studies today -Suspect that his iron deficiency is likely secondary to frequent blood donation (has been donating every 8 weeks) - Will reduce frequency of donations to every quarter -No further workup at this time

## 2024-07-26 NOTE — Assessment & Plan Note (Signed)
-   This problem is chronic and stable -Blood pressure today is 124/76 -He is compliant with hydrochlorothiazide  25 mg daily as well as lisinopril  40 mg daily -He had a CMP done last month which was within normal limits -Will continue current medication regimen for now -No further workup at this time

## 2024-07-26 NOTE — Assessment & Plan Note (Signed)
-   Patient was noted to have prediabetes on his blood work done last month -A1c is up to 6.2 -We discussed diet and excise interventions -Will need to follow-up with us  in 3 to 6 months to ensure that his A1c is improving -May benefit from initiation of metformin if his A1c worsens

## 2024-07-26 NOTE — Assessment & Plan Note (Signed)
-   Patient states that he sleeps only 5 to 6 hours a night -States he does not feel refreshed when he wakes up in the morning -He is currently on treatment for hypertension -He does complain of some daytime somnolence especially after lunch -Patient STOP-BANG score is 4 which places him at high risk for obstructive sleep apnea -We will obtain a home sleep study.  Patient does not want referral to be evaluated for this but is willing to do a home sleep study -No further workup at this time

## 2024-07-26 NOTE — Progress Notes (Signed)
 Acute Office Visit  Subjective:     Patient ID: John Murray, male    DOB: 1986/05/25, 38 y.o.   MRN: [id number]  Chief Complaint  Patient presents with   Annual Exam   Discussed the use of AI scribe software for clinical note transcription with the patient, who gave verbal consent to proceed.  History of Present Illness John Murray is a 38 year old male who presents for follow-up regarding iron deficiency and blood work monitoring.  Iron deficiency and hematologic monitoring - Undergoing regular blood work every three months with urology, monitoring CRT, hemoglobin, and hematocrit levels - Annual physical for [employer] includes full panel and heavy metals testing - Recent labs: blood counts relatively normal, but mean corpuscular volume slightly decreased and iron studies show low iron levels - Initiated iron supplementation on June 24, 2024 (approximately one month ago) - History of elevated hemoglobin resulting in increased blood viscosity; previously managed with blood donation every eight weeks, now reduced to once per quarter to maintain balance - No significant symptoms related to iron deficiency or anemia  Hypertension management - Currently taking hydrochlorothiazide  and lisinopril  for blood pressure control - No adverse effects from antihypertensive medications - Kidney function checked last month; not repeated at this visit  Glycemic control and lipid abnormalities - Recent laboratory work for occupational health showed some cholesterol levels remain slightly out of range - Hemoglobin A1c increased to 6.2 - Focusing on dietary modifications and regular exercise to address glycemic control and dyslipidemia  Lifestyle and physical activity - Engages in exercise three to four times per week, sessions lasting one to one and a half hours - Exercise regimen includes both cardiovascular and resistance training - Working on dietary changes to improve overall  health metrics  Sleep disturbance and daytime fatigue - Variable sleep quality, often feeling unrested - Typically obtains five to six hours of sleep per night without feeling refreshed - Daytime sleepiness, particularly after lunch - Attributes poor sleep to shift work, young child, and pets - Occasional snoring without continuous symptoms of sleep apnea - No prior sleep study performed     Review of Systems  Constitutional: Negative.   HENT: Negative.    Respiratory: Negative.    Cardiovascular: Negative.   Gastrointestinal: Negative.   Genitourinary: Negative.   Musculoskeletal: Negative.   Neurological: Negative.   Psychiatric/Behavioral:  The patient has insomnia.         Objective:    BP 124/76   Pulse 73   Temp 98.5 F (36.9 C)   Ht 5' 9 (1.753 m)   Wt 223 lb 6.4 oz (101.3 kg)   SpO2 99%   BMI 32.99 kg/m    Physical Exam Constitutional:      Appearance: Normal appearance.  HENT:     Head: Normocephalic and atraumatic.  Cardiovascular:     Rate and Rhythm: Normal rate and regular rhythm.     Heart sounds: Normal heart sounds.  Pulmonary:     Effort: Pulmonary effort is normal.     Breath sounds: Normal breath sounds. No wheezing, rhonchi or rales.  Abdominal:     General: Bowel sounds are normal. There is no distension.     Palpations: Abdomen is soft.     Tenderness: There is no abdominal tenderness. There is no guarding or rebound.  Musculoskeletal:        General: No swelling or tenderness.     Right lower leg: No edema.  Left lower leg: No edema.  Neurological:     Mental Status: He is alert.  Psychiatric:        Mood and Affect: Mood normal.        Behavior: Behavior normal.     No results found for any visits on 07/26/24.      Assessment & Plan:   Problem List Items Addressed This Visit       Cardiovascular and Mediastinum   HTN (hypertension)   - This problem is chronic and stable -Blood pressure today is 124/76 -He is  compliant with hydrochlorothiazide  25 mg daily as well as lisinopril  40 mg daily -He had a CMP done last month which was within normal limits -Will continue current medication regimen for now -No further workup at this time        Endocrine   Hypogonadism in male   - This problem is chronic and stable -Patient is on testosterone  replacement therapy with urology and follows up with them every 3 months -He will continue to follow-up with them for replacement -He gets his testosterone  and CBC monitored regularly with urology -No further workup at this time        Other   Annual physical exam   - Patient here for his annual physical exam -Flu vaccine given today -Not due for any cancer screenings at this time      Insomnia   - Patient states that he sleeps only 5 to 6 hours a night -States he does not feel refreshed when he wakes up in the morning -He is currently on treatment for hypertension -He does complain of some daytime somnolence especially after lunch -Patient STOP-BANG score is 4 which places him at high risk for obstructive sleep apnea -We will obtain a home sleep study.  Patient does not want referral to be evaluated for this but is willing to do a home sleep study -No further workup at this time      Relevant Orders   Home sleep test   Ambulatory referral to Sleep Studies   Iron deficiency   - Patient was diagnosed with iron deficiency last month and started on iron supplements by his PCP -Will recheck a CBC and iron studies today -Suspect that his iron deficiency is likely secondary to frequent blood donation (has been donating every 8 weeks) - Will reduce frequency of donations to every quarter -No further workup at this time      Relevant Orders   CBC with Differential/Platelet   Iron, TIBC and Ferritin Panel   Mixed hyperlipidemia   - This problem is chronic and stable -Patient did have a lipid panel done last month which showed an elevated LDL of 127,  low HDL of 35 as well as elevated triglycerides in the 170s -We discussed diet and exercise in detail -Will need to recheck his lipid panel in 6 months to assess for improvement -No further workup at this time      Prediabetes   - Patient was noted to have prediabetes on his blood work done last month -A1c is up to 6.2 -We discussed diet and excise interventions -Will need to follow-up with us  in 3 to 6 months to ensure that his A1c is improving -May benefit from initiation of metformin if his A1c worsens      Other Visit Diagnoses       Need for immunization against influenza    -  Primary   Relevant Orders   Flu vaccine trivalent  PF, 6mos and older(Flulaval,Afluria,Fluarix,Fluzone) (Completed)       No orders of the defined types were placed in this encounter.   No follow-ups on file.  Dayyan Krist, MD

## 2024-07-27 ENCOUNTER — Ambulatory Visit: Payer: Self-pay | Admitting: Internal Medicine

## 2024-07-27 DIAGNOSIS — E611 Iron deficiency: Secondary | ICD-10-CM

## 2024-07-27 LAB — IRON,TIBC AND FERRITIN PANEL
%SAT: 12 % — ABNORMAL LOW (ref 20–48)
Ferritin: 14 ng/mL — ABNORMAL LOW (ref 38–380)
Iron: 52 ug/dL (ref 50–180)
TIBC: 422 ug/dL (ref 250–425)

## 2024-09-01 ENCOUNTER — Telehealth: Payer: Self-pay

## 2024-09-01 NOTE — Telephone Encounter (Signed)
 [facility] sleep therapy order formed signed by Dr. Narendra and faxed to [facility] and received an ok confirmation.

## 2024-09-07 NOTE — Telephone Encounter (Unsigned)
 Copied from CRM #[id number]. Topic: Clinical - Lab/Test Results >> Sep 07, 2024 12:20 PM Rea ORN wrote: Reason for CRM: Pt would like results of home sleep study that was done at the end of October. Pt stated he never heard from office but he got a call from [facility] to discuss machine options. He is concerned because he was never given results or next steps.   Please call back [phone]

## 2024-09-08 NOTE — Telephone Encounter (Signed)
 Sleep study faxed to [facility] placed on your desk.

## 2024-09-09 NOTE — Telephone Encounter (Signed)
 Attempted to call Patient and his phone said call can not be completed at this time , try again later.

## 2024-09-13 ENCOUNTER — Telehealth: Payer: Self-pay

## 2024-09-13 NOTE — Telephone Encounter (Signed)
 Attempted to call Patient but his phone says calls can not be accepted at this time try again later.

## 2024-09-13 NOTE — Telephone Encounter (Signed)
 Copied from CRM [id number]. Topic: Clinical - Request for Lab/Test Order >> Sep 13, 2024 12:52 PM Nessti S wrote: Reason for CRM: pt called because he wants to have his thyroids level check as well for appt 12/16

## 2024-09-14 ENCOUNTER — Other Ambulatory Visit: Payer: Self-pay | Admitting: Nurse Practitioner

## 2024-09-14 DIAGNOSIS — Z1329 Encounter for screening for other suspected endocrine disorder: Secondary | ICD-10-CM

## 2024-09-14 DIAGNOSIS — R7303 Prediabetes: Secondary | ICD-10-CM

## 2024-09-14 NOTE — Telephone Encounter (Signed)
 No, I called and tried to leave messages but his phone always says calls can not be accepted at this time try again later.

## 2024-09-15 ENCOUNTER — Telehealth: Payer: Self-pay | Admitting: Internal Medicine

## 2024-09-15 NOTE — Telephone Encounter (Signed)
 I called the patient to discuss results of his sleep study with him.  His sleep study was consistent with moderate sleep apnea.  We discussed the importance of starting on a CPAP machine.  Patient states that he is starting a new insurance in January and would like to hold off until his new insurance is active.  He also states that he has bought a smart watch and states that his sleep scores have been good.  We decided to hold off on the CPAP machine at this time and he will recheck sleep study next year with his new PCP and if he still has moderate sleep apnea will consider a CPAP machine at that time.

## 2024-09-20 ENCOUNTER — Other Ambulatory Visit: Payer: Self-pay | Admitting: Nurse Practitioner

## 2024-09-20 DIAGNOSIS — I10 Essential (primary) hypertension: Secondary | ICD-10-CM

## 2024-09-21 ENCOUNTER — Other Ambulatory Visit

## 2024-09-21 DIAGNOSIS — R7303 Prediabetes: Secondary | ICD-10-CM

## 2024-09-21 DIAGNOSIS — Z1329 Encounter for screening for other suspected endocrine disorder: Secondary | ICD-10-CM

## 2024-09-21 LAB — TSH: TSH: 0.31 u[IU]/mL — ABNORMAL LOW (ref 0.35–5.50)

## 2024-09-21 LAB — HEMOGLOBIN A1C: Hgb A1c MFr Bld: 5.5 % (ref 4.6–6.5)

## 2024-09-28 DIAGNOSIS — R7989 Other specified abnormal findings of blood chemistry: Secondary | ICD-10-CM

## 2024-12-08 ENCOUNTER — Other Ambulatory Visit
